# Patient Record
Sex: Female | Born: 1935 | Race: White | Hispanic: No | Smoking: Former smoker
Health system: Southern US, Community
[De-identification: ages and names within clinical notes are randomized; demographics above are authoritative.]

## PROBLEM LIST (undated history)

## (undated) DIAGNOSIS — K219 Gastro-esophageal reflux disease without esophagitis: Secondary | ICD-10-CM

## (undated) DIAGNOSIS — I509 Heart failure, unspecified: Secondary | ICD-10-CM

## (undated) DIAGNOSIS — Z8673 Personal history of transient ischemic attack (TIA), and cerebral infarction without residual deficits: Secondary | ICD-10-CM

## (undated) DIAGNOSIS — E785 Hyperlipidemia, unspecified: Secondary | ICD-10-CM

## (undated) DIAGNOSIS — J449 Chronic obstructive pulmonary disease, unspecified: Secondary | ICD-10-CM

## (undated) DIAGNOSIS — I2510 Atherosclerotic heart disease of native coronary artery without angina pectoris: Secondary | ICD-10-CM

## (undated) DIAGNOSIS — Z950 Presence of cardiac pacemaker: Secondary | ICD-10-CM

## (undated) DIAGNOSIS — G4700 Insomnia, unspecified: Secondary | ICD-10-CM

## (undated) DIAGNOSIS — M719 Bursopathy, unspecified: Secondary | ICD-10-CM

## (undated) DIAGNOSIS — I10 Essential (primary) hypertension: Secondary | ICD-10-CM

## (undated) DIAGNOSIS — I739 Peripheral vascular disease, unspecified: Secondary | ICD-10-CM

## (undated) DIAGNOSIS — M1990 Unspecified osteoarthritis, unspecified site: Secondary | ICD-10-CM

## (undated) DIAGNOSIS — D649 Anemia, unspecified: Secondary | ICD-10-CM

## (undated) DIAGNOSIS — R011 Cardiac murmur, unspecified: Secondary | ICD-10-CM

## (undated) DIAGNOSIS — R7303 Prediabetes: Secondary | ICD-10-CM

## (undated) DIAGNOSIS — E663 Overweight: Secondary | ICD-10-CM

## (undated) DIAGNOSIS — I350 Nonrheumatic aortic (valve) stenosis: Secondary | ICD-10-CM

## (undated) DIAGNOSIS — N8110 Cystocele, unspecified: Secondary | ICD-10-CM

## (undated) DIAGNOSIS — I519 Heart disease, unspecified: Secondary | ICD-10-CM

## (undated) DIAGNOSIS — N289 Disorder of kidney and ureter, unspecified: Secondary | ICD-10-CM

## (undated) DIAGNOSIS — R0602 Shortness of breath: Secondary | ICD-10-CM

## (undated) DIAGNOSIS — I4891 Unspecified atrial fibrillation: Secondary | ICD-10-CM

## (undated) HISTORY — DX: Overweight: E66.3

## (undated) HISTORY — DX: Heart disease, unspecified: I51.9

## (undated) HISTORY — DX: Presence of cardiac pacemaker: Z95.0

## (undated) HISTORY — DX: Disorder of kidney and ureter, unspecified: N28.9

## (undated) HISTORY — DX: Hyperlipidemia, unspecified: E78.5

## (undated) HISTORY — DX: Essential (primary) hypertension: I10

## (undated) HISTORY — DX: Atherosclerotic heart disease of native coronary artery without angina pectoris: I25.10

## (undated) HISTORY — DX: Insomnia, unspecified: G47.00

## (undated) HISTORY — DX: Nonrheumatic aortic (valve) stenosis: I35.0

## (undated) HISTORY — DX: Cystocele, unspecified: N81.10

## (undated) HISTORY — DX: Unspecified atrial fibrillation: I48.91

## (undated) HISTORY — PX: EYE SURGERY: SHX253

## (undated) HISTORY — DX: Chronic obstructive pulmonary disease, unspecified: J44.9

## (undated) HISTORY — DX: Personal history of transient ischemic attack (TIA), and cerebral infarction without residual deficits: Z86.73

## (undated) HISTORY — DX: Heart failure, unspecified: I50.9

## (undated) HISTORY — DX: Peripheral vascular disease, unspecified: I73.9

## (undated) HISTORY — DX: Bursopathy, unspecified: M71.9

---

## 2003-11-18 HISTORY — PX: WRIST SURGERY: SHX841

## 2007-11-18 HISTORY — PX: OTHER SURGICAL HISTORY: SHX169

## 2009-11-17 DIAGNOSIS — Z8673 Personal history of transient ischemic attack (TIA), and cerebral infarction without residual deficits: Secondary | ICD-10-CM

## 2009-11-17 HISTORY — PX: INSERT / REPLACE / REMOVE PACEMAKER: SUR710

## 2009-11-17 HISTORY — DX: Personal history of transient ischemic attack (TIA), and cerebral infarction without residual deficits: Z86.73

## 2010-09-17 DIAGNOSIS — Z950 Presence of cardiac pacemaker: Secondary | ICD-10-CM | POA: Diagnosis present

## 2010-09-17 HISTORY — DX: Presence of cardiac pacemaker: Z95.0

## 2010-10-21 ENCOUNTER — Ambulatory Visit: Payer: Self-pay | Admitting: Family Medicine

## 2010-10-21 DIAGNOSIS — M25559 Pain in unspecified hip: Secondary | ICD-10-CM

## 2010-10-21 DIAGNOSIS — I10 Essential (primary) hypertension: Secondary | ICD-10-CM | POA: Insufficient documentation

## 2010-10-21 DIAGNOSIS — M76899 Other specified enthesopathies of unspecified lower limb, excluding foot: Secondary | ICD-10-CM | POA: Insufficient documentation

## 2010-10-21 DIAGNOSIS — E785 Hyperlipidemia, unspecified: Secondary | ICD-10-CM

## 2010-10-23 ENCOUNTER — Telehealth (INDEPENDENT_AMBULATORY_CARE_PROVIDER_SITE_OTHER): Payer: Self-pay | Admitting: *Deleted

## 2010-11-13 ENCOUNTER — Encounter: Payer: Self-pay | Admitting: Family Medicine

## 2010-12-17 NOTE — Letter (Signed)
Summary: CONTROLLED MED PRESCRIPTION POLICY  CONTROLLED MED PRESCRIPTION POLICY   Imported By: Dannette Barbara 10/21/2010 11:26:56  _____________________________________________________________________  External Attachment:    Type:   Image     Comment:   External Document

## 2010-12-17 NOTE — Assessment & Plan Note (Signed)
Summary: PAIN IN RIGHT LEG (rm 4)   Vital Signs:  Patient Profile:   77 Years Old Female CC:      right leg pain x 2 weeks Height:     61 inches Weight:      112 pounds O2 Sat:      99 % O2 treatment:    Room Air Temp:     98.5 degrees F oral Pulse rate:   60 / minute Resp:     12 per minute BP sitting:   144 / 78  (right arm) Cuff size:   regular  Pt. in pain?   yes    Location:   right leg    Intensity:   7    Type:       burning/sharp  Vitals Entered By: Lajean Saver RN (October 21, 2010 11:13 AM)                   Updated Prior Medication List: AMITRIPTYLINE HCL 10 MG TABS (AMITRIPTYLINE HCL) qhs AMLODIPINE BESYLATE 10 MG TABS (AMLODIPINE BESYLATE)  ASPIRIN 81 MG TBEC (ASPIRIN)  CATAPRES 0.1 MG TABS (CLONIDINE HCL)  LISINOPRIL 5 MG TABS (LISINOPRIL)  MELOXICAM 7.5 MG TABS (MELOXICAM)  METOPROLOL SUCCINATE 50 MG XR24H-TAB (METOPROLOL SUCCINATE)  MULTIVITAMINS  TABS (MULTIPLE VITAMIN)  ZOCOR 20 MG TABS (SIMVASTATIN)   Current Allergies: ! PCN ! NOVOCAINHistory of Present Illness Chief Complaint: right leg pain x 2 weeks History of Present Illness:  Subjective:  Patient complains of pain in her right upper leg and hip for about 2 weeks.  In October, a large dog (lab) jumped up on her chest and she lost her balance but did not fall.  She has had some vague right lower back pain since then.  About a week ago she presented to the ER in [place] with chest symptoms and discovered to have an arrhythmia and she was admitted for pacemaker placement.  She states that an X-ray was taken of her right knee which was negative. She has pain in her right hip area when walking, and when supine on her right side at night.  REVIEW OF SYSTEMS Constitutional Symptoms      Denies fever, chills, night sweats, weight loss, weight gain, and fatigue.  Eyes       Denies change in vision, eye pain, eye discharge, glasses, contact lenses, and eye surgery. Ear/Nose/Throat/Mouth  Denies hearing loss/aids, change in hearing, ear pain, ear discharge, dizziness, frequent runny nose, frequent nose bleeds, sinus problems, sore throat, hoarseness, and tooth pain or bleeding.  Respiratory       Denies dry cough, productive cough, wheezing, shortness of breath, asthma, bronchitis, and emphysema/COPD.  Cardiovascular       Denies murmurs, chest pain, and tires easily with exhertion.    Gastrointestinal       Denies stomach pain, nausea/vomiting, diarrhea, constipation, blood in bowel movements, and indigestion. Genitourniary       Denies painful urination, blood or discharge from vagina, kidney stones, and loss of urinary control. Neurological       Denies paralysis, seizures, and fainting/blackouts. Musculoskeletal       Complains of muscle pain and swelling.      Denies joint pain, joint stiffness, decreased range of motion, redness, muscle weakness, and gout.      Comments: right leg Skin       Denies bruising, unusual mles/lumps or sores, and hair/skin or nail changes.  Psych       Denies  mood changes, temper/anger issues, anxiety/stress, speech problems, depression, and sleep problems. Other Comments: Patient c/o right leg pain x 2 weeks. It starts at her thigh and burns down to below her knee where there is a small knot. No redness or warmth is present. She states a dog jumped on her about 2 weeks ago.    Past History:  Past Medical History: PAD Hypertension Hyperlipidemia Pacemaker Murmur Cataracts  Past Surgical History: Permanent pacemaker wrist reconstruction after fall 2005 Artery replacement from rt arm to left leg  Social History: Current Smoker 1/2 PPD Alcohol use-no Drug use-no Smoking Status:  current Drug Use:  no   Objective:  No acute distress, alert and comfortable Neck:  Supple.  No adenopathy is present.   Lungs:  Clear to auscultation.  Breath sounds are equal.  Heart:  Regular rate and rhythm  Abdomen:  Nontender without masses  or hepatosplenomegaly.  Bowel sounds are present.  No CVA or flank tenderness.  Back:  Nontender Right hip:  Distinct tenderness over the greater trochanter Lower extremities:  No erythema, swelling, or tenderness. X-ray right hip:  negative Assessment New Problems: TROCHANTERIC BURSITIS, RIGHT (ICD-726.5) HIP PAIN, RIGHT (ICD-719.45) HYPERLIPIDEMIA (ICD-272.4) HYPERTENSION (ICD-401.9)   Plan New Medications/Changes: LORTAB 5 5-500 MG TABS (HYDROCODONE-ACETAMINOPHEN) One-half to one tab by mouth hs as needed pain  #10 (ten) x 0, 10/21/2010, Donna Christen MD FLECTOR 1.3 % [facility] (DICLOFENAC EPOLAMINE) Apply one-half to one patch to painful area bid  #15 x 0, 10/21/2010, Donna Christen MD  New Orders: T-DG Hip Complete*R* [16109] New Patient Level III [99203] Planning Comments:   Begin Flector patch to right hip.  Apply ice pack several times daily.  Analgesic at bedtime. Begin hip range of motion exercises ([facility] information and instruction patient handout given)  If not improving two weeks, recommend referral to sports med clinic.   The patient and/or caregiver has been counseled thoroughly with regard to medications prescribed including dosage, schedule, interactions, rationale for use, and possible side effects and they verbalize understanding.  Diagnoses and expected course of recovery discussed and will return if not improved as expected or if the condition worsens. Patient and/or caregiver verbalized understanding.  Prescriptions: LORTAB 5 5-500 MG TABS (HYDROCODONE-ACETAMINOPHEN) One-half to one tab by mouth hs as needed pain  #10 (ten) x 0   Entered and Authorized by:   Donna Christen MD   Signed by:   Donna Christen MD on 10/21/2010   Method used:   Print then Give to Patient   RxID:   [id number] FLECTOR 1.3 % [facility] (DICLOFENAC EPOLAMINE) Apply one-half to one patch to painful area bid  #15 x 0   Entered and Authorized by:   Donna Christen MD   Signed by:   Donna Christen MD on 10/21/2010   Method used:   Print then Give to Patient   RxID:   [id number]   Orders Added: 1)  T-DG Hip Complete*R* [29528] 2)  New Patient Level III [41324]

## 2010-12-17 NOTE — Progress Notes (Signed)
  Phone Note Outgoing Call Call back at [facility] Phone [phone]   Call placed by: Lajean Saver RN,  October 23, 2010 3:25 PM Call placed to: Patient Action Taken: Phone Call Completed Summary of Call: Returned patient's message requesting an alternate medication from the Flector patch that was denied by Medicaid. I informed patient I was calling in a gel as an alternative.  Diclofenac Gel 1% 100gm tube 5 pack called in to [facility] in [facility]. I spoke with Baxter Hire who referred me to the voicemail. Rx left on thier voicemail. Told to use two times a day as needed pain.

## 2010-12-19 NOTE — Letter (Signed)
Summary: APROVAL OF MEDICARE RX  APROVAL OF MEDICARE RX   Imported By: Dannette Barbara 11/13/2010 08:20:58  _____________________________________________________________________  External Attachment:    Type:   Image     Comment:   External Document

## 2011-09-25 ENCOUNTER — Other Ambulatory Visit (HOSPITAL_COMMUNITY)
Admission: RE | Admit: 2011-09-25 | Discharge: 2011-09-25 | Disposition: A | Discharge status: Home / Self Care | Payer: Medicare Other | Source: Ambulatory Visit | Attending: Obstetrics and Gynecology | Admitting: Obstetrics and Gynecology

## 2011-09-25 DIAGNOSIS — Z1159 Encounter for screening for other viral diseases: Secondary | ICD-10-CM | POA: Insufficient documentation

## 2011-09-25 DIAGNOSIS — Z124 Encounter for screening for malignant neoplasm of cervix: Secondary | ICD-10-CM | POA: Insufficient documentation

## 2011-11-18 HISTORY — PX: CORONARY ARTERY BYPASS GRAFT: SHX141

## 2011-11-18 HISTORY — PX: CARDIAC VALVE REPLACEMENT: SHX585

## 2012-02-16 ENCOUNTER — Encounter: Payer: Self-pay | Admitting: Surgery

## 2012-02-23 ENCOUNTER — Encounter: Payer: Medicare Other | Admitting: Surgery

## 2012-05-07 ENCOUNTER — Encounter: Payer: Self-pay | Admitting: Surgery

## 2012-05-10 ENCOUNTER — Ambulatory Visit (INDEPENDENT_AMBULATORY_CARE_PROVIDER_SITE_OTHER): Payer: Medicare Other | Admitting: Surgery

## 2012-05-10 ENCOUNTER — Encounter: Payer: Self-pay | Admitting: Surgery

## 2012-05-10 ENCOUNTER — Encounter (INDEPENDENT_AMBULATORY_CARE_PROVIDER_SITE_OTHER): Payer: Medicare Other | Admitting: *Deleted

## 2012-05-10 VITALS — BP 179/131 | HR 62 | Temp 98.6°F | Ht 60.0 in | Wt 134.0 lb

## 2012-05-10 DIAGNOSIS — Z48812 Encounter for surgical aftercare following surgery on the circulatory system: Secondary | ICD-10-CM

## 2012-05-10 DIAGNOSIS — I739 Peripheral vascular disease, unspecified: Secondary | ICD-10-CM

## 2012-05-10 DIAGNOSIS — I70219 Atherosclerosis of native arteries of extremities with intermittent claudication, unspecified extremity: Secondary | ICD-10-CM | POA: Insufficient documentation

## 2012-05-10 NOTE — Progress Notes (Signed)
[facility]   Patient name: Chelsey Campbell MRN: [id number] DOB: 12-27-1927 Sex: female   Referred by: Dr. Sandi Mealy  Reason for referral:  Chief Complaint  Patient presents with  . PVD    New pt, referred by Dr. Sandi Mealy    HISTORY OF PRESENT ILLNESS: This is a 78 year old female that I am asked to see and evaluate for peripheral vascular disease. She is status post left leg bypass graft in 2009 in [place] by Dr. Craige Cotta. According to the patient this was done for acute ischemia. I do not have any records available. She had a vein harvested from her right arm. She has no lower extremity complaints today. She denies claudication. She denies ulceration. She denies rest pain.  She is recently status post open-heart surgery and valve replacement in March of this year. She suffers from hypertension which has been poorly controlled. Her cholesterol is reportedly under adequate control.  Past Medical History  Diagnosis Date  . Hypertension   . Hyperlipidemia   . Insomnia   . Overweight   . Vaginal prolapse     with pesary  . S/P cardiac pacemaker procedure November 2011    for tachy/brady syndrome  . Peripheral vascular disease     S/P  femoral bypass  . History of TIA (transient ischemic attack)   . Bursitis     in joints  . Aortic stenosis, severe   . Renal insufficiency   . Ventricular dysfunction     Left mild with EF 45-50%  . CHF (congestive heart failure)   . COPD (chronic obstructive pulmonary disease)     Past Surgical History  Procedure Date  . Eye surgery      Bilateral Cataracts  . Coronary artery bypass graft 2013    replaced aortic valve    History   Social History  . Marital Status: Single    Spouse Name: N/A    Number of Children: N/A  . Years of Education: N/A   Occupational History  . Not on file.   Social History Main Topics  . Smoking status: Former Smoker -- 50 years    Types: Cigarettes    Quit date: 10/11/2010  .  Smokeless tobacco: Never Used  . Alcohol Use: No  . Drug Use: No  . Sexually Active: Not on file   Other Topics Concern  . Not on file   Social History Narrative  . No narrative on file    Family History  Problem Relation Age of Onset  . Heart disease Father   . Cancer Sister     brain    Allergies as of 05/10/2012 - Review Complete 05/10/2012  Allergen Reaction Noted  . Procaine hcl Anaphylaxis 10/21/2010  . Penicillins Rash 10/21/2010    Current Outpatient Prescriptions on File Prior to Visit  Medication Sig Dispense Refill  . albuterol (PROVENTIL HFA;VENTOLIN HFA) 108 (90 BASE) MCG/ACT inhaler Inhale 2 puffs into the lungs. Used at night      . aspirin 81 MG tablet Take 81 mg by mouth daily.      . Multiple Vitamins-Minerals (MULTIVITAMIN WITH MINERALS) tablet Take 1 tablet by mouth daily.      . nortriptyline (PAMELOR) 10 MG capsule Take 10 mg by mouth at bedtime.      Dani Gobble SULFATE VAGINAL (TRIMO-SAN) 0.025 % GEL Place 0.025 g vaginally 3 (three) times a week.      . simvastatin (ZOCOR) 10 MG tablet Take 20 mg  by mouth at bedtime.      Marland Kitchen tiotropium (SPIRIVA) 18 MCG inhalation capsule Place 18 mcg into inhaler and inhale daily.      Marland Kitchen amitriptyline (ELAVIL) 10 MG tablet Take 10 mg by mouth at bedtime.      Marland Kitchen AMLODIPINE BESYLATE PO Take 10 mg by mouth daily.      . cloNIDine HCl (KAPVAY) 0.1 MG TB12 ER tablet Take by mouth.      Marland Kitchen lisinopril (PRINIVIL,ZESTRIL) 2.5 MG tablet Take 2.5 mg by mouth daily.      Marland Kitchen METOPROLOL TARTRATE PO Take 100 mg by mouth 2 (two) times daily.          REVIEW OF SYSTEMS: Cardiovascular: No chest pain, chest pressure, palpitations, orthopnea, or dyspnea on exertion. No claudication or rest pain,  No history of DVT or phlebitis. Pulmonary: No productive cough, asthma or wheezing. Neurologic: No weakness, paresthesias, aphasia, or amaurosis. No dizziness. Hematologic: No bleeding problems or clotting disorders. Musculoskeletal: No  joint pain or joint swelling. Gastrointestinal: No blood in stool or hematemesis Genitourinary: No dysuria or hematuria. Psychiatric:: No history of major depression. Integumentary: No rashes or ulcers. Constitutional: No fever or chills.  PHYSICAL EXAMINATION: General: The patient appears their stated age.  Vital signs are BP 179/131  Pulse 62  Temp 98.6 F (37 C) (Oral)  Ht 5' (1.524 m)  Wt 134 lb (60.782 kg)  BMI 26.17 kg/m2  SpO2 97% HEENT:  No gross abnormalities Pulmonary: Respirations are non-labored Musculoskeletal: There are no major deformities.   Neurologic: No focal weakness or paresthesias are detected, Skin: There are no ulcer or rashes noted. Psychiatric: The patient has normal affect. Cardiovascular: There is a regular rate and rhythm without significant murmur appreciated. I cannot palpate a pedal pulse on the left. There is mild edema bilaterally.  Diagnostic Studies: I ordered and reviewed her duplex ultrasound today. This shows an ABI of 0.92 on the left with triphasic waveforms of 0.95 on the right with triphasic waveforms. Duplex of the bypass graft reveals no evidence of stenosis.   Assessment:  Peripheral vascular disease status post left leg bypass graft Plan: Based on her ultrasound today her bypass graft is widely patent. There is no evidence of stenosis. The patient is here to establish vascular care and followup. There are no acute vascular issues. She will need to have carotid surveillance done I sent this was done prior to her heart surgery I do not have these results. She will be scheduled to come back to see me in one year with a repeat ultrasound to evaluate her bypass graft.     Jorge Ny, M.D. [facility] [place] Office: [phone] Pager:  [phone]

## 2012-05-10 NOTE — Procedures (Unsigned)
LOWER EXTREMITY ARTERIAL DUPLEX  INDICATION:  Followup left lower extremity bypass graft.  HISTORY: Diabetes:  No Cardiac:  Aortic stenosis, pacemaker Hypertension:  Yes Smoking:  Previous Previous Surgery:  Left femoral bypass performed in [place] in 2009  SINGLE LEVEL ARTERIAL EXAM                         RIGHT                LEFT Brachial: Anterior tibial: Posterior tibial: Peroneal: Ankle/Brachial Index:   0.95                 0.92  LOWER EXTREMITY ARTERIAL DUPLEX EXAM  DUPLEX:  Patent left mid SFA to posterior tibial artery bypass graft with triphasic waveforms noted throughout the bypass graft and the native vessels.  IMPRESSION: 1. Patent left lower extremity bypass graft with no evidence of     stenosis. 2. Bilateral ankle brachial indices are suggestive of mild arterial     disease. 3. No prior ankle brachial indices for comparison.  ___________________________________________ V. Charlena Cross, MD  EM/MEDQ  D:  05/10/2012  T:  05/10/2012  Job:  [id number]

## 2012-05-18 ENCOUNTER — Other Ambulatory Visit: Payer: Self-pay

## 2012-05-18 DIAGNOSIS — I739 Peripheral vascular disease, unspecified: Secondary | ICD-10-CM

## 2012-05-18 DIAGNOSIS — Z48812 Encounter for surgical aftercare following surgery on the circulatory system: Secondary | ICD-10-CM

## 2012-12-31 ENCOUNTER — Encounter (HOSPITAL_COMMUNITY): Payer: Self-pay | Admitting: Pharmacist

## 2013-01-04 ENCOUNTER — Other Ambulatory Visit: Payer: Self-pay | Admitting: Obstetrics and Gynecology

## 2013-01-05 ENCOUNTER — Encounter (HOSPITAL_COMMUNITY)
Admission: RE | Admit: 2013-01-05 | Discharge: 2013-01-05 | Disposition: A | Discharge status: Home / Self Care | Payer: Medicare Other | Source: Ambulatory Visit | Attending: Obstetrics and Gynecology | Admitting: Obstetrics and Gynecology

## 2013-01-05 ENCOUNTER — Encounter (HOSPITAL_COMMUNITY): Payer: Self-pay

## 2013-01-05 HISTORY — DX: Anemia, unspecified: D64.9

## 2013-01-05 HISTORY — DX: Cardiac murmur, unspecified: R01.1

## 2013-01-05 HISTORY — DX: Shortness of breath: R06.02

## 2013-01-05 HISTORY — DX: Gastro-esophageal reflux disease without esophagitis: K21.9

## 2013-01-05 HISTORY — DX: Presence of cardiac pacemaker: Z95.0

## 2013-01-05 LAB — BASIC METABOLIC PANEL
CO2: 24 mEq/L (ref 19–32)
Chloride: 103 mEq/L (ref 96–112)
Glucose, Bld: 141 mg/dL — ABNORMAL HIGH (ref 70–99)
Potassium: 4.7 mEq/L (ref 3.5–5.1)
Sodium: 138 mEq/L (ref 135–145)

## 2013-01-05 LAB — CBC
HCT: 42 % (ref 36.0–46.0)
Hemoglobin: 14.1 g/dL (ref 12.0–15.0)
MCH: 31.1 pg (ref 26.0–34.0)
MCV: 92.5 fL (ref 78.0–100.0)
RBC: 4.54 MIL/uL (ref 3.87–5.11)

## 2013-01-05 LAB — SURGICAL PCR SCREEN: MRSA, PCR: NEGATIVE

## 2013-01-05 NOTE — Pre-Procedure Instructions (Signed)
Dr Rodman Pickle saw patient at PAT appt.  Medical history, meds were reviewed and all questions answered.

## 2013-01-05 NOTE — Patient Instructions (Signed)
   Your procedure is scheduled on:  Wednesday Feb 26  Enter through the [employer] of [facility] at: 11 am Pick up the phone at the desk and dial [phone] and inform us of your arrival.  Please call this number if you have any problems the morning of surgery: [phone]  Remember: Do not eat food after midnight: Tuesday Do not drink clear liquids after: 830 am Wednesday Take these medicines the morning of surgery with a SIP OF WATER:  Do not wear jewelry, make-up, or FINGER nail polish No metal in your hair or on your body. Do not wear lotions, powders, perfumes. You may wear deodorant.  Please use your CHG wash as directed prior to surgery.  Do not shave anywhere for at least 12 hours prior to first CHG shower.  Do not bring valuables to the hospital. Contacts, dentures or bridgework may not be worn into surgery.  Leave suitcase in the car. After Surgery it may be brought to your room. For patients being admitted to the hospital, checkout time is 11:00am the day of discharge.  Home with daughter Odelia Gage.

## 2013-01-11 MED ORDER — CIPROFLOXACIN IN D5W 400 MG/200ML IV SOLN
400.0000 mg | INTRAVENOUS | Status: AC
Start: 2013-01-12 — End: 2013-01-12
  Administered 2013-01-12: 400 mg via INTRAVENOUS
  Filled 2013-01-11: qty 200

## 2013-01-11 MED ORDER — CLINDAMYCIN PHOSPHATE 900 MG/50ML IV SOLN
900.0000 mg | INTRAVENOUS | Status: DC
  Filled 2013-01-11: qty 50

## 2013-01-12 ENCOUNTER — Ambulatory Visit (HOSPITAL_COMMUNITY): Payer: Medicare Other

## 2013-01-12 ENCOUNTER — Ambulatory Visit (HOSPITAL_COMMUNITY): Payer: Medicare Other | Admitting: Anesthesiology

## 2013-01-12 ENCOUNTER — Ambulatory Visit (HOSPITAL_COMMUNITY)
Admission: RE | Admit: 2013-01-12 | Discharge: 2013-01-14 | Disposition: A | Discharge status: Home / Self Care | Payer: Medicare Other | Source: Ambulatory Visit | Attending: Obstetrics and Gynecology | Admitting: Obstetrics and Gynecology

## 2013-01-12 ENCOUNTER — Encounter (HOSPITAL_COMMUNITY)
Admission: RE | Disposition: A | Discharge status: Home / Self Care | Payer: Self-pay | Source: Ambulatory Visit | Attending: Obstetrics and Gynecology

## 2013-01-12 ENCOUNTER — Encounter (HOSPITAL_COMMUNITY): Payer: Self-pay | Admitting: Anesthesiology

## 2013-01-12 DIAGNOSIS — N189 Chronic kidney disease, unspecified: Secondary | ICD-10-CM | POA: Diagnosis present

## 2013-01-12 DIAGNOSIS — Z90710 Acquired absence of both cervix and uterus: Secondary | ICD-10-CM

## 2013-01-12 DIAGNOSIS — N838 Other noninflammatory disorders of ovary, fallopian tube and broad ligament: Secondary | ICD-10-CM | POA: Insufficient documentation

## 2013-01-12 DIAGNOSIS — N812 Incomplete uterovaginal prolapse: Secondary | ICD-10-CM | POA: Insufficient documentation

## 2013-01-12 DIAGNOSIS — N83209 Unspecified ovarian cyst, unspecified side: Secondary | ICD-10-CM | POA: Insufficient documentation

## 2013-01-12 DIAGNOSIS — D251 Intramural leiomyoma of uterus: Secondary | ICD-10-CM | POA: Insufficient documentation

## 2013-01-12 DIAGNOSIS — N289 Disorder of kidney and ureter, unspecified: Secondary | ICD-10-CM | POA: Insufficient documentation

## 2013-01-12 DIAGNOSIS — N179 Acute kidney failure, unspecified: Secondary | ICD-10-CM | POA: Diagnosis present

## 2013-01-12 DIAGNOSIS — D279 Benign neoplasm of unspecified ovary: Secondary | ICD-10-CM | POA: Insufficient documentation

## 2013-01-12 HISTORY — PX: SALPINGOOPHORECTOMY: SHX82

## 2013-01-12 HISTORY — PX: ABDOMINAL HYSTERECTOMY: SHX81

## 2013-01-12 HISTORY — PX: CYSTOSCOPY: SHX5120

## 2013-01-12 HISTORY — PX: CYSTOCELE REPAIR: SHX163

## 2013-01-12 HISTORY — PX: LAPAROSCOPIC ASSISTED VAGINAL HYSTERECTOMY: SHX5398

## 2013-01-12 SURGERY — HYSTERECTOMY, VAGINAL, LAPAROSCOPY-ASSISTED
Anesthesia: General | Site: Vagina | Wound class: Clean Contaminated

## 2013-01-12 MED ORDER — KETOROLAC TROMETHAMINE 30 MG/ML IJ SOLN: 15.0000 mg | Freq: Once | INTRAMUSCULAR | Status: DC | PRN

## 2013-01-12 MED ORDER — EPHEDRINE SULFATE 50 MG/ML IJ SOLN
INTRAMUSCULAR | Status: DC | PRN
  Administered 2013-01-12: 15 mg via INTRAVENOUS
  Administered 2013-01-12: 1 mg via INTRAVENOUS

## 2013-01-12 MED ORDER — IRBESARTAN 300 MG PO TABS
300.0000 mg | ORAL_TABLET | Freq: Every day | ORAL | Status: DC
  Filled 2013-01-12: qty 1

## 2013-01-12 MED ORDER — HYDROMORPHONE HCL PF 1 MG/ML IJ SOLN: 0.2000 mg | INTRAMUSCULAR | Status: DC | PRN

## 2013-01-12 MED ORDER — EPHEDRINE 5 MG/ML INJ
INTRAVENOUS | Status: AC
  Filled 2013-01-12: qty 10

## 2013-01-12 MED ORDER — FENTANYL CITRATE 0.05 MG/ML IJ SOLN
INTRAMUSCULAR | Status: DC | PRN
Start: 2013-01-12 — End: 2013-01-12
  Administered 2013-01-12: 50 ug via INTRAVENOUS
  Administered 2013-01-12: 25 ug via INTRAVENOUS
  Administered 2013-01-12: 50 ug via INTRAVENOUS
  Administered 2013-01-12 (×3): 25 ug via INTRAVENOUS

## 2013-01-12 MED ORDER — BUPIVACAINE HCL (PF) 0.25 % IJ SOLN
INTRAMUSCULAR | Status: AC
  Filled 2013-01-12: qty 30

## 2013-01-12 MED ORDER — PHENYLEPHRINE HCL 10 MG/ML IJ SOLN
INTRAMUSCULAR | Status: DC | PRN
  Administered 2013-01-12: 80 ug via INTRAVENOUS
  Administered 2013-01-12: 160 ug via INTRAVENOUS
  Administered 2013-01-12: 80 ug via INTRAVENOUS
  Administered 2013-01-12 (×2): 120 ug via INTRAVENOUS
  Administered 2013-01-12: 40 ug via INTRAVENOUS

## 2013-01-12 MED ORDER — ONDANSETRON HCL 4 MG/2ML IJ SOLN
INTRAMUSCULAR | Status: AC
  Filled 2013-01-12: qty 2

## 2013-01-12 MED ORDER — MIDAZOLAM HCL 2 MG/2ML IJ SOLN
INTRAMUSCULAR | Status: AC
  Filled 2013-01-12: qty 2

## 2013-01-12 MED ORDER — HEPARIN SODIUM (PORCINE) 5000 UNIT/ML IJ SOLN
INTRAMUSCULAR | Status: DC | PRN
  Administered 2013-01-12: 5000 [IU] via SUBCUTANEOUS

## 2013-01-12 MED ORDER — DIPHENHYDRAMINE HCL 50 MG/ML IJ SOLN
INTRAMUSCULAR | Status: DC | PRN
  Administered 2013-01-12 (×2): 12.5 mg via INTRAVENOUS

## 2013-01-12 MED ORDER — FUROSEMIDE 10 MG/ML IJ SOLN
INTRAMUSCULAR | Status: AC
  Filled 2013-01-12: qty 2

## 2013-01-12 MED ORDER — LACTATED RINGERS IV SOLN
INTRAVENOUS | Status: DC
  Administered 2013-01-12 (×2): via INTRAVENOUS

## 2013-01-12 MED ORDER — INDIGOTINDISULFONATE SODIUM 8 MG/ML IJ SOLN
INTRAMUSCULAR | Status: DC | PRN
  Administered 2013-01-12: 5 mL via INTRAVENOUS

## 2013-01-12 MED ORDER — FENTANYL CITRATE 0.05 MG/ML IJ SOLN
INTRAMUSCULAR | Status: AC
  Filled 2013-01-12: qty 2

## 2013-01-12 MED ORDER — FAMOTIDINE 20 MG PO TABS: 20.0000 mg | ORAL_TABLET | Freq: Two times a day (BID) | ORAL | Status: DC | PRN

## 2013-01-12 MED ORDER — DIPHENHYDRAMINE HCL 50 MG/ML IJ SOLN: 50.0000 mg | Freq: Once | INTRAMUSCULAR | Status: DC

## 2013-01-12 MED ORDER — ONDANSETRON HCL 4 MG/2ML IJ SOLN
INTRAMUSCULAR | Status: DC | PRN
  Administered 2013-01-12: 4 mg via INTRAVENOUS

## 2013-01-12 MED ORDER — PHENYLEPHRINE 40 MCG/ML (10ML) SYRINGE FOR IV PUSH (FOR BLOOD PRESSURE SUPPORT)
PREFILLED_SYRINGE | INTRAVENOUS | Status: AC
  Filled 2013-01-12: qty 10

## 2013-01-12 MED ORDER — PROPOFOL 10 MG/ML IV EMUL
INTRAVENOUS | Status: DC | PRN
  Administered 2013-01-12: 50 mg via INTRAVENOUS
  Administered 2013-01-12: 150 mg via INTRAVENOUS

## 2013-01-12 MED ORDER — ESTRADIOL 0.1 MG/GM VA CREA
TOPICAL_CREAM | VAGINAL | Status: DC | PRN
  Administered 2013-01-12: 1 via VAGINAL

## 2013-01-12 MED ORDER — LIDOCAINE-EPINEPHRINE 1 %-1:100000 IJ SOLN
INTRAMUSCULAR | Status: DC | PRN
  Administered 2013-01-12 (×2): 20 mL

## 2013-01-12 MED ORDER — PHENYLEPHRINE 40 MCG/ML (10ML) SYRINGE FOR IV PUSH (FOR BLOOD PRESSURE SUPPORT)
PREFILLED_SYRINGE | INTRAVENOUS | Status: AC
  Filled 2013-01-12: qty 5

## 2013-01-12 MED ORDER — KETOROLAC TROMETHAMINE 30 MG/ML IJ SOLN
INTRAMUSCULAR | Status: AC
  Filled 2013-01-12: qty 1

## 2013-01-12 MED ORDER — BUPIVACAINE HCL (PF) 0.25 % IJ SOLN
INTRAMUSCULAR | Status: DC | PRN
  Administered 2013-01-12: 20 mL

## 2013-01-12 MED ORDER — PHENYLEPHRINE HCL 10 MG/ML IJ SOLN
10.0000 mg | INTRAVENOUS | Status: DC | PRN
  Administered 2013-01-12: 50 ug/min via INTRAVENOUS
  Administered 2013-01-12: 15:00:00 via INTRAVENOUS

## 2013-01-12 MED ORDER — MEPERIDINE HCL 25 MG/ML IJ SOLN: 6.2500 mg | INTRAMUSCULAR | Status: DC | PRN

## 2013-01-12 MED ORDER — IRBESARTAN 300 MG PO TABS
300.0000 mg | ORAL_TABLET | Freq: Every day | ORAL | Status: DC
  Filled 2013-01-12 (×2): qty 1

## 2013-01-12 MED ORDER — LACTATED RINGERS IV SOLN
INTRAVENOUS | Status: DC
  Administered 2013-01-12 (×2): via INTRAVENOUS

## 2013-01-12 MED ORDER — DEXAMETHASONE SODIUM PHOSPHATE 4 MG/ML IJ SOLN
INTRAMUSCULAR | Status: DC | PRN
  Administered 2013-01-12: 10 mg via INTRAVENOUS

## 2013-01-12 MED ORDER — OXYCODONE-ACETAMINOPHEN 5-325 MG PO TABS
1.0000 | ORAL_TABLET | ORAL | Status: DC | PRN
  Administered 2013-01-13 (×2): 1 via ORAL
  Filled 2013-01-12 (×2): qty 1

## 2013-01-12 MED ORDER — FENTANYL CITRATE 0.05 MG/ML IJ SOLN: 25.0000 ug | INTRAMUSCULAR | Status: DC | PRN

## 2013-01-12 MED ORDER — SODIUM CHLORIDE 0.9 % IV SOLN: 250.0000 mL | INTRAVENOUS | Status: DC | PRN

## 2013-01-12 MED ORDER — ESTRADIOL 0.1 MG/GM VA CREA
TOPICAL_CREAM | VAGINAL | Status: AC
  Filled 2013-01-12: qty 42.5

## 2013-01-12 MED ORDER — FAMOTIDINE 20 MG PO TABS
ORAL_TABLET | ORAL | Status: AC
  Administered 2013-01-12: 20 mg via ORAL
  Filled 2013-01-12: qty 1

## 2013-01-12 MED ORDER — FAMOTIDINE 20 MG PO TABS: 20.0000 mg | ORAL_TABLET | Freq: Once | ORAL | Status: AC

## 2013-01-12 MED ORDER — STERILE WATER FOR IRRIGATION IR SOLN
Status: DC | PRN
  Administered 2013-01-12: 1000 mL

## 2013-01-12 MED ORDER — PHENYLEPHRINE HCL 10 MG/ML IJ SOLN
INTRAMUSCULAR | Status: AC
  Filled 2013-01-12: qty 1

## 2013-01-12 MED ORDER — LIDOCAINE HCL (CARDIAC) 20 MG/ML IV SOLN
INTRAVENOUS | Status: AC
  Filled 2013-01-12: qty 5

## 2013-01-12 MED ORDER — GENTAMICIN SULFATE 40 MG/ML IJ SOLN
Freq: Once | INTRAVENOUS | Status: AC
  Administered 2013-01-12: 112.25 mL via INTRAVENOUS
  Filled 2013-01-12: qty 6.25

## 2013-01-12 MED ORDER — ALBUTEROL SULFATE HFA 108 (90 BASE) MCG/ACT IN AERS
2.0000 | INHALATION_SPRAY | Freq: Four times a day (QID) | RESPIRATORY_TRACT | Status: DC | PRN
  Administered 2013-01-13: 2 via RESPIRATORY_TRACT
  Filled 2013-01-12: qty 6.7

## 2013-01-12 MED ORDER — ONDANSETRON HCL 4 MG PO TABS: 4.0000 mg | ORAL_TABLET | Freq: Four times a day (QID) | ORAL | Status: DC | PRN

## 2013-01-12 MED ORDER — SODIUM CHLORIDE 0.9 % IJ SOLN
3.0000 mL | Freq: Two times a day (BID) | INTRAMUSCULAR | Status: DC
  Administered 2013-01-13: 3 mL via INTRAVENOUS

## 2013-01-12 MED ORDER — LACTATED RINGERS IR SOLN
Status: DC | PRN
  Administered 2013-01-12: 3000 mL

## 2013-01-12 MED ORDER — SODIUM CHLORIDE 0.9 % IJ SOLN
3.0000 mL | INTRAMUSCULAR | Status: DC | PRN
  Administered 2013-01-12: 3 mL via INTRAVENOUS

## 2013-01-12 MED ORDER — DIPHENHYDRAMINE HCL 50 MG/ML IJ SOLN
INTRAMUSCULAR | Status: AC
  Filled 2013-01-12: qty 1

## 2013-01-12 MED ORDER — HYDROCHLOROTHIAZIDE 25 MG PO TABS
25.0000 mg | ORAL_TABLET | Freq: Every day | ORAL | Status: DC
  Administered 2013-01-13: 25 mg via ORAL
  Filled 2013-01-12 (×2): qty 1

## 2013-01-12 MED ORDER — INDIGOTINDISULFONATE SODIUM 8 MG/ML IJ SOLN
INTRAMUSCULAR | Status: AC
  Filled 2013-01-12: qty 5

## 2013-01-12 MED ORDER — TIOTROPIUM BROMIDE MONOHYDRATE 18 MCG IN CAPS
18.0000 ug | ORAL_CAPSULE | Freq: Every day | RESPIRATORY_TRACT | Status: DC
  Filled 2013-01-12: qty 5

## 2013-01-12 MED ORDER — FUROSEMIDE 10 MG/ML IJ SOLN
INTRAMUSCULAR | Status: DC | PRN
  Administered 2013-01-12: 10 mg via INTRAVENOUS

## 2013-01-12 MED ORDER — AMLODIPINE BESYLATE 2.5 MG PO TABS
2.5000 mg | ORAL_TABLET | Freq: Every day | ORAL | Status: DC
  Administered 2013-01-12 – 2013-01-13 (×2): 2.5 mg via ORAL
  Filled 2013-01-12 (×3): qty 1

## 2013-01-12 MED ORDER — 0.9 % SODIUM CHLORIDE (POUR BTL) OPTIME
TOPICAL | Status: DC | PRN
  Administered 2013-01-12: 1000 mL

## 2013-01-12 MED ORDER — FUROSEMIDE 10 MG/ML IJ SOLN
10.0000 mg | Freq: Once | INTRAMUSCULAR | Status: AC
  Administered 2013-01-12: 10 mg via INTRAVENOUS
  Filled 2013-01-12: qty 2

## 2013-01-12 MED ORDER — METOPROLOL TARTRATE 100 MG PO TABS
100.0000 mg | ORAL_TABLET | Freq: Two times a day (BID) | ORAL | Status: DC
  Administered 2013-01-13 (×2): 100 mg via ORAL
  Filled 2013-01-12 (×4): qty 1

## 2013-01-12 MED ORDER — ONDANSETRON HCL 4 MG/2ML IJ SOLN: 4.0000 mg | Freq: Four times a day (QID) | INTRAMUSCULAR | Status: DC | PRN

## 2013-01-12 MED ORDER — SIMETHICONE 80 MG PO CHEW: 80.0000 mg | CHEWABLE_TABLET | Freq: Four times a day (QID) | ORAL | Status: DC | PRN

## 2013-01-12 MED ORDER — PROPOFOL 10 MG/ML IV EMUL
INTRAVENOUS | Status: AC
  Filled 2013-01-12: qty 20

## 2013-01-12 MED ORDER — LIDOCAINE HCL (CARDIAC) 20 MG/ML IV SOLN
INTRAVENOUS | Status: DC | PRN
  Administered 2013-01-12: 20 mg via INTRAVENOUS

## 2013-01-12 MED ORDER — ONDANSETRON HCL 4 MG/2ML IJ SOLN: 4.0000 mg | Freq: Once | INTRAMUSCULAR | Status: DC | PRN

## 2013-01-12 SURGICAL SUPPLY — 55 items
APPLICATOR COTTON TIP 6IN STRL (MISCELLANEOUS) ×6 IMPLANT
CANISTER SUCTION 2500CC (MISCELLANEOUS) ×6 IMPLANT
CATH ROBINSON RED A/P 16FR (CATHETERS) IMPLANT
CLOTH BEACON ORANGE TIMEOUT ST (SAFETY) ×12 IMPLANT
CONT PATH 16OZ SNAP LID 3702 (MISCELLANEOUS) ×6 IMPLANT
CONTAINER PREFILL 10% NBF 60ML (FORM) ×6 IMPLANT
COVER TABLE BACK 60X90 (DRAPES) ×6 IMPLANT
DECANTER SPIKE VIAL GLASS SM (MISCELLANEOUS) ×12 IMPLANT
DERMABOND ADHESIVE PROPEN (GAUZE/BANDAGES/DRESSINGS) ×1
DERMABOND ADVANCED (GAUZE/BANDAGES/DRESSINGS) ×1
DERMABOND ADVANCED .7 DNX12 (GAUZE/BANDAGES/DRESSINGS) ×5 IMPLANT
DERMABOND ADVANCED .7 DNX6 (GAUZE/BANDAGES/DRESSINGS) ×5 IMPLANT
DRAPE STERI URO 9X17 APER PCH (DRAPES) ×6 IMPLANT
ELECT LIGASURE LONG (ELECTRODE) IMPLANT
ELECT LIGASURE SHORT 9 REUSE (ELECTRODE) IMPLANT
ELECT REM PT RETURN 9FT ADLT (ELECTROSURGICAL) ×6
ELECTRODE REM PT RTRN 9FT ADLT (ELECTROSURGICAL) ×5 IMPLANT
FILTER SMOKE EVAC LAPAROSHD (FILTER) ×6 IMPLANT
GAUZE PACKING 2X5 YD STERILE (GAUZE/BANDAGES/DRESSINGS) ×6 IMPLANT
GAUZE SPONGE 4X4 16PLY XRAY LF (GAUZE/BANDAGES/DRESSINGS) ×6 IMPLANT
GLOVE BIOGEL M 6.5 STRL (GLOVE) ×30 IMPLANT
GLOVE BIOGEL PI IND STRL 6.5 (GLOVE) ×15 IMPLANT
GLOVE BIOGEL PI INDICATOR 6.5 (GLOVE) ×3
GOWN PREVENTION PLUS XLARGE (GOWN DISPOSABLE) ×12 IMPLANT
GOWN STRL REIN XL XLG (GOWN DISPOSABLE) ×24 IMPLANT
NEEDLE HYPO 22GX1.5 SAFETY (NEEDLE) IMPLANT
NS IRRIG 1000ML POUR BTL (IV SOLUTION) ×12 IMPLANT
PACK LAVH (CUSTOM PROCEDURE TRAY) ×6 IMPLANT
PACK VAGINAL WOMENS (CUSTOM PROCEDURE TRAY) IMPLANT
PROTECTOR NERVE ULNAR (MISCELLANEOUS) ×6 IMPLANT
SEALER TISSUE G2 CVD JAW 35 (ENDOMECHANICALS) ×5 IMPLANT
SEALER TISSUE G2 CVD JAW 45CM (ENDOMECHANICALS) ×1
SET CYSTO W/LG BORE CLAMP LF (SET/KITS/TRAYS/PACK) ×6 IMPLANT
SET IRRIG TUBING LAPAROSCOPIC (IRRIGATION / IRRIGATOR) ×6 IMPLANT
SOLUTION ELECTROLUBE (MISCELLANEOUS) IMPLANT
STRIP CLOSURE SKIN 1/4X3 (GAUZE/BANDAGES/DRESSINGS) IMPLANT
SUT CHROMIC 2 0 CT 1 (SUTURE) ×12 IMPLANT
SUT MON AB 4-0 PS1 27 (SUTURE) ×6 IMPLANT
SUT VIC AB 0 CT1 18XCR BRD8 (SUTURE) ×10 IMPLANT
SUT VIC AB 0 CT1 27 (SUTURE) ×4
SUT VIC AB 0 CT1 27XBRD ANBCTR (SUTURE) ×20 IMPLANT
SUT VIC AB 0 CT1 36 (SUTURE) ×18 IMPLANT
SUT VIC AB 0 CT1 8-18 (SUTURE) ×2
SUT VIC AB 0 CTXB 36 (SUTURE) IMPLANT
SUT VIC AB 2-0 CT1 (SUTURE) IMPLANT
SUT VIC AB 2-0 CT2 27 (SUTURE) ×12 IMPLANT
SUT VIC AB 2-0 SH 27 (SUTURE)
SUT VIC AB 2-0 SH 27XBRD (SUTURE) IMPLANT
SUT VICRYL 1 TIES 12X18 (SUTURE) ×6 IMPLANT
SUT VICRYL 4-0 PS2 18IN ABS (SUTURE) IMPLANT
TOWEL OR 17X24 6PK STRL BLUE (TOWEL DISPOSABLE) ×24 IMPLANT
TRAY FOLEY CATH 14FR (SET/KITS/TRAYS/PACK) ×12 IMPLANT
TROCAR XCEL NON-BLD 5MMX100MML (ENDOMECHANICALS) ×18 IMPLANT
WARMER LAPAROSCOPE (MISCELLANEOUS) ×6 IMPLANT
WATER STERILE IRR 1000ML POUR (IV SOLUTION) ×12 IMPLANT

## 2013-01-12 NOTE — Anesthesia Postprocedure Evaluation (Signed)
  Anesthesia Post Note  Patient: Chelsey Campbell  Procedure(s) Performed: Procedure(s) (LRB): LAPAROSCOPIC ASSISTED VAGINAL HYSTERECTOMY (N/A) SALPINGO OOPHORECTOMY (Bilateral) ANTERIOR REPAIR (CYSTOCELE) (N/A) CYSTOSCOPY (N/A)  Anesthesia type: GA  Patient location: PACU  Post pain: Pain level controlled  Post assessment: Post-op Vital signs reviewed  Last Vitals:  Filed Vitals:   01/12/13 1600  BP: 129/47  Pulse: 53  Temp:   Resp: 20    Post vital signs: Reviewed  Level of consciousness: sedated  Complications: No apparent anesthesia complications

## 2013-01-12 NOTE — Anesthesia Preprocedure Evaluation (Signed)
Anesthesia Evaluation  Patient identified by MRN, date of birth, ID band Patient awake    Reviewed: Allergy & Precautions, H&P , NPO status , Patient's Chart, lab work & pertinent test results, reviewed documented beta blocker date and time   Airway Mallampati: II      Dental no notable dental hx.    Pulmonary neg pulmonary ROS,  breath sounds clear to auscultation        Cardiovascular hypertension, Pt. on medications and Pt. on home beta blockers negative cardio ROS  Rhythm:Regular Rate:Normal - Systolic murmurs See Cardiology note from 12/23/12   Neuro/Psych negative neurological ROS  negative psych ROS   GI/Hepatic Neg liver ROS,   Endo/Other  negative endocrine ROS  Renal/GU   negative genitourinary   Musculoskeletal negative musculoskeletal ROS (+)   Abdominal Normal abdominal exam  (+)   Peds negative pediatric ROS (+)  Hematology negative hematology ROS (+)   Anesthesia Other Findings   Reproductive/Obstetrics                           Anesthesia Physical Anesthesia Plan  ASA: III  Anesthesia Plan: General   Post-op Pain Management:    Induction: Intravenous  Airway Management Planned: Oral ETT  Additional Equipment:   Intra-op Plan:   Post-operative Plan: Extubation in OR  Informed Consent:   Dental advisory given  Plan Discussed with: CRNA and Surgeon  Anesthesia Plan Comments: (1. Five lead ECG)        Anesthesia Quick Evaluation

## 2013-01-12 NOTE — Op Note (Signed)
01/12/2013  5:22 PM  PATIENT:  Chelsey Campbell  79 y.o. female  PRE-OPERATIVE DIAGNOSIS:  Uterine Prolapse 2 Cystocele  3. Complex left adnexal cyst  CPT Code 16109  POST-OPERATIVE DIAGNOSIS:  Same   CPT Code 60454  PROCEDURE:  Procedure(s): LAPAROSCOPIC ASSISTED VAGINAL HYSTERECTOMY (N/A) SALPINGO OOPHORECTOMY (Bilateral) ANTERIOR REPAIR (CYSTOCELE) (N/A) CYSTOSCOPY (N/A)  SURGEON:  Surgeon(s) and Role:    * Trevyn Lumpkin J. Richardson Dopp, MD - Primary    * Geryl Rankins, MD - Assisting  PHYSICIAN ASSISTANT: none   ASSISTANTS: Dr Geryl Rankins   ANESTHESIA:   general  EBL:  Total I/O In: 1900 [I.V.:1900] Out: 650 [Urine:575; Blood:75]  BLOOD ADMINISTERED:none  DRAINS: Urinary Catheter (Foley)   LOCAL MEDICATIONS USED:  MARCAINE    and LIDOCAINE   SPECIMEN:  Source of Specimen:  uterus cervix bilateral fallopian tubes and ovaries. left adnexal mass   DISPOSITION OF SPECIMEN:  PATHOLOGY  COUNTS:  YES  TOURNIQUET:  * No tourniquets in log *  DICTATION: .Dragon Dictation  PLAN OF CARE: Admit for overnight observation  PATIENT DISPOSITION:  ICU - extubated and stable.   Delay start of Pharmacological VTE agent (>24 hrs) due to surgical blood loss or risk of bleeding: not applicable    Procedure: Pt was taken to the operating room #4. She was placed in the dorsal lithotomy position prepped and draped in the normal sterile fashion. Speculum was placed into the vagina The anterior lip of the cervix was grasped with a single tooth tenaculum. The uterus was sounded to 8 cm. The [place] uterine manipulator was placed without difficulty.  Attention was turned to the patients abdomen where the umbilicus was injected with 25% marcaine. A 5 mm trocar was placed under direct visualization. Pneumoperitoneum was achieved with pCO2. The pelvis was examined with the findings noted above. A 5 mm trocar was placed in the right and left lower quadrants under direct visualization. The right ureter was  identified. The left ureter could not be visualized. The infundibula pelvic ligament on the right was cauterized and transected with the enseal. Followed by cauterization of the right round ligament. The bladder reflection was dissected to the midline. The uterine artery on the right was cauterized and transected.  The infundibula pelvic ligament on the left was cauterized and transected with the enseal. Followed by cauterization of the left round ligament. The bladder reflection was dissected to the midline. The uterine artery on the Left t was cauterized and transected. The laparoscopic instruments were removed and pneumoperitoneum was released.  Attention was turned to the vagina. The uterine manipulator was removed. A weighted speculum was placed. The cervix was grasped with tenaculums. The cervix was injected circumferentially with 1% lidocaine with epi. The cervix was thin incised circumferentially. The posterior culdesac was entered sharply. The bladder was dissected of the lower uterine segment and the anterior peritoneum was entered sharply. The uterosacral ligaments were grasped with haney clamps bilaterally transected and suture li gated with 0 vicryl. The cardinal ligaments clamped with the haney clamps transected and suture li gated with 0 vicryl. The uterus cervix and bilateral fallopian tubes and ovaries were completely excised at this point and sent to pathology.  A modified McCall suture of 0 vicryl was placed. Angle sutures of 0 vicryl were placed bilaterally. The McCall suture stitch was then tied. The anterior vaginal mucosa was injected with 1% lidocaine with epi. It was then incised with Metzenbaum scissors. The cystocele was dissected off the anterior vaginal mucosa with  sharp and blunt dissection. The pubovesical fascia was re-approximated with interrupted suture of 0 vicryl. The redundant vaginal mucosa was excised.  The vaginal mucosa with re-approximated with running locked  suture of  0 vicryl T Excellent hemostasis was noted.  Vaginal packing was placed.  Attention was then turned to the abdomen were the pneumoperitoneum was reestablished .  The ureters could not be visualized.  The trocars were removed under direct visualization. Pneumoperitoneum was released prior to removal of the umbilical trocar The abdominal incisions were closed with 4-0 vicryl and derma bond.   Indigo carmine was given IV and cystoscopy was performed with 30 degree cystoscope. No bladder injury was noted. Indigo Carmine was expressed from both ureters. The cystoscope was removed and the foley was replaced. The patient was awaken from anesthesia and taken to the recovery room awake and in stable condition.  Sponge lap and needle counts were correct times two.

## 2013-01-12 NOTE — Progress Notes (Addendum)
Notified by RN that pt had faint crackles at bases of lungs. VS reviewed.  O2 sat 96% on 2 liters I/Os reveiwed.  +~1100.  UOP 125 cc in last 2 hours per RN. Urine clear Gen:  NAD, upright in bed, [place] in place, comfortable CV:  RRR Lungs:  Mild bibasilar crackles, no rhonchi A/P:  Mild pulmonary edema by exam.  Volume status ahead. H/o Renal insufficiency, CAD Give Lasix IV now. Pt tolerating po.  Saline lock IV.  Strict I/Os. Chest Xray s/p Lasix. Encouraged incentive spirometry.

## 2013-01-12 NOTE — H&P (Signed)
Date of Initial H&P: 01/05/2013  History reviewed, patient examined, Pt had an allergic reaction at the infusion site  to cipro  In preop holding area..  Will switch preop antibiotic to gentamicin and clindamycin.

## 2013-01-12 NOTE — Transfer of Care (Signed)
Immediate Anesthesia Transfer of Care Note  Patient: Chelsey Campbell  Procedure(s) Performed: Procedure(s): LAPAROSCOPIC ASSISTED VAGINAL HYSTERECTOMY (N/A) SALPINGO OOPHORECTOMY (Bilateral) ANTERIOR REPAIR (CYSTOCELE) (N/A) CYSTOSCOPY (N/A)  Patient Location: PACU  Anesthesia Type:General  Level of Consciousness: sedated  Airway & Oxygen Therapy: Patient Spontanous Breathing and Patient connected to nasal cannula oxygen  Post-op Assessment: Report given to PACU RN and Post -op Vital signs reviewed and stable  Post vital signs: stable  Complications: No apparent anesthesia complications

## 2013-01-13 ENCOUNTER — Encounter (HOSPITAL_COMMUNITY): Payer: Self-pay | Admitting: Obstetrics and Gynecology

## 2013-01-13 DIAGNOSIS — N189 Chronic kidney disease, unspecified: Secondary | ICD-10-CM

## 2013-01-13 DIAGNOSIS — N179 Acute kidney failure, unspecified: Secondary | ICD-10-CM | POA: Diagnosis present

## 2013-01-13 LAB — BASIC METABOLIC PANEL
Calcium: 8.7 mg/dL (ref 8.4–10.5)
Creatinine, Ser: 1.72 mg/dL — ABNORMAL HIGH (ref 0.50–1.10)
GFR calc Af Amer: 31 mL/min — ABNORMAL LOW (ref >90)
GFR calc non Af Amer: 26 mL/min — ABNORMAL LOW (ref >90)
Sodium: 135 mEq/L (ref 135–145)

## 2013-01-13 LAB — CBC
MCH: 30.5 pg (ref 26.0–34.0)
MCHC: 33.4 g/dL (ref 30.0–36.0)
MCV: 91.4 fL (ref 78.0–100.0)
Platelets: 142 10*3/uL — ABNORMAL LOW (ref 150–400)
RBC: 3.83 MIL/uL — ABNORMAL LOW (ref 3.87–5.11)
RDW: 14.3 % (ref 11.5–15.5)

## 2013-01-13 MED FILL — Heparin Sodium (Porcine) Inj 5000 Unit/ML: INTRAMUSCULAR | Qty: 1 | Status: AC

## 2013-01-13 NOTE — Progress Notes (Signed)
Subjective: Patient reports nausea, vomiting, tolerating PO, + flatus and no problems voiding.    Objective: I have reviewed patient's vital signs, intake and output, medications and labs.  General: alert and cooperative CV rrr Lungs clear to auscultation no rrw Abd: soft appropriately tender nondistended +BS Ext no edema    Assessment/Plan: POD #1 s/p lavh/ bso/ anterior repair and cystoscopy Doing well from surgical perspective with return of bowel and bladder function.  however increased creatinine Pt has known renal insufficiency however due to increase will consult hospitalits for any additional management recommendations. Plan repeat bmet in am. Probable d/c home tomorrow// Dr. Dion Body covering until 1700 today and Dr. Christell Constant covering this evening.    LOS: 1 day    Chelsey Campbell J. 01/13/2013, 12:26 PM

## 2013-01-13 NOTE — Consult Note (Signed)
[facility] Medical Consultation  [name]:[id number] DOB: [birth year] DOA: 01/12/2013 PCP: [name], MD   Requesting physician: Dr.Varnado  Reason for consultation: rise in BUN/creatinine  Impression/Recommendations  1. AKI on CKD 3 -slight bump in BUN and creatinine is likely due to hypotension overnight, along with concomitant ARB(Avapro) use. - Volume status is pretty even, clinically do not think she needs more IVF at this point. - Strict I/Os -Hold HCTZ and ARB(Avapro) -FU bmet in am - IF creatinine does not continue to trend up can be Dced home to resume ARB in 2-3 days  - FU CBC in am with ongoing intermittent vaginal blood loss.    Thank you for this consultation, will follow    HPI: 84/F s/p Vaginal hysterectomy yetserday for prolapsed uterus yesterday, she has multiple medical problems notable for CAD, CKD, HTN, AS, LV dysfunction EF of 45-50%. She had mild crackles overnight and received a one time dose of IV lasix, in addition her BP dropped in the 90s several times overnight, she also had a CXR which showed atelectasis. She denies any complaints at this time except for some abdominal discomfort and vaginal bleeding post op.   Review of Systems:  12 system review negative expect for abdominal discomfort and some vaginal bleeding postop  Past Medical History  Diagnosis Date  . Hypertension   . Hyperlipidemia   . Insomnia   . Overweight   . Vaginal prolapse     with pesary  . S/P cardiac pacemaker procedure November 2011    for tachy/brady syndrome  . Peripheral vascular disease     S/P  femoral bypass  . History of TIA (transient ischemic attack) 2011    TIA episode without an actual TIA   . Bursitis     in joints - hips/neck  . Aortic stenosis, severe   . Ventricular dysfunction     Left mild with EF 45-50%  . CHF (congestive heart failure)   . COPD (chronic obstructive pulmonary disease)   . SVD (spontaneous vaginal delivery)      x 3  . Renal insufficiency     borderline - r/t bp - Dr Briant Cedar at [facility]  . Pacemaker     [employer]  . Shortness of breath     occasionally with exertion  . Heart murmur   . GERD (gastroesophageal reflux disease)   . Anemia     history   Past Surgical History  Procedure Laterality Date  . Eye surgery       Bilateral Cataracts  . Coronary artery bypass graft  2013    replaced aortic valve  . Pad  2009    vein right arm to left leg   . Wrist surgery  2005    fell broke both wrists and had reconstrictive surgery on both wrists  . Cardiac valve replacement  2013    aorta replacement w/ stent  . Insert / replace / remove pacemaker  2011    [employer]  . Laparoscopic assisted vaginal hysterectomy N/A 01/12/2013    Procedure: LAPAROSCOPIC ASSISTED VAGINAL HYSTERECTOMY;  Surgeon: [name]. Richardson Dopp, MD;  Location: [facility];  Service: Gynecology;  Laterality: N/A;  . Salpingoophorectomy Bilateral 01/12/2013    Procedure: SALPINGO OOPHORECTOMY;  Surgeon: [name]. Richardson Dopp, MD;  Location: [facility];  Service: Gynecology;  Laterality: Bilateral;  . Cystocele repair N/A 01/12/2013    Procedure: ANTERIOR REPAIR (CYSTOCELE);  Surgeon: [name]. Richardson Dopp, MD;  Location: [facility];  Service: Gynecology;  Laterality: N/A;  . Cystoscopy N/A 01/12/2013    Procedure: CYSTOSCOPY;  Surgeon: [name]. Richardson Dopp, MD;  Location: [facility];  Service: Gynecology;  Laterality: N/A;   Social History:  reports that she quit smoking about 2 years ago. Her smoking use included Cigarettes. She has a 60 pack-year smoking history. She has never used smokeless tobacco. She reports that she does not drink alcohol or use illicit drugs.  Allergies  Allergen Reactions  . Procaine Hcl Anaphylaxis  . Morphine And Related Other (See Comments)    Patient gets disoriented/lost memory and doesn't tolerate it well.  Patient does not want morphine used.  . Ciprofloxacin Other (See Comments)    Infusion site reaction  . Labetalol  Hcl   . Penicillins Rash   Family History  Problem Relation Age of Onset  . Heart disease Father   . Cancer Sister     brain    Prior to Admission medications   Medication Sig Start Date End Date Taking? Authorizing Provider  albuterol (PROVENTIL HFA;VENTOLIN HFA) 108 (90 BASE) MCG/ACT inhaler Inhale 2 puffs into the lungs every 6 (six) hours as needed for wheezing or shortness of breath. Used at night   Yes Historical Provider, MD  AMLODIPINE BESYLATE PO Take 2.5 mg by mouth daily.    Yes Historical Provider, MD  aspirin 81 MG tablet Take 81 mg by mouth daily.   Yes Historical Provider, MD  B Complex Vitamins (VITAMIN B COMPLEX PO) Take 1 tablet by mouth daily.   Yes Historical Provider, MD  hydrochlorothiazide (HYDRODIURIL) 25 MG tablet Take 25 mg by mouth daily.   Yes Historical Provider, MD  METOPROLOL TARTRATE PO Take 100 mg by mouth 2 (two) times daily.    Yes Historical Provider, MD  nortriptyline (PAMELOR) 10 MG capsule Take 10 mg by mouth at bedtime.   Yes Historical Provider, MD  OXYQUINOLONE SULFATE VAGINAL (TRIMO-SAN) 0.025 % GEL Place 0.025 g vaginally 3 (three) times a week.   Yes Historical Provider, MD  ranitidine (ZANTAC) 150 MG tablet Take 150 mg by mouth as needed for heartburn.   Yes Historical Provider, MD  simvastatin (ZOCOR) 10 MG tablet Take 20 mg by mouth at bedtime.   Yes Historical Provider, MD  telmisartan (MICARDIS) 80 MG tablet Take 80 mg by mouth daily.   Yes Historical Provider, MD  tiotropium (SPIRIVA) 18 MCG inhalation capsule Place 18 mcg into inhaler and inhale daily.   Yes Historical Provider, MD   Physical Exam: Blood pressure 134/58, pulse 57, temperature 97.3 F (36.3 C), temperature source Oral, resp. rate 16, height 4\' 11"  (1.499 m), weight 62.778 kg (138 lb 6.4 oz), SpO2 96.00%. Filed Vitals:   01/13/13 1135 01/13/13 1150 01/13/13 1227 01/13/13 1356  BP:  120/63  134/58  Pulse: 60 59 47 57  Temp:  97.4 F (36.3 C)  97.3 F (36.3 C)   TempSrc:  Oral  Oral  Resp: 15 15 15 16   Height:      Weight:      SpO2: 97% 95% 96% 96%     General:  AAOx3, no distress  HEENT: PERRLA, EOmi, no JVD  Cardiovascular: S1S2/RRR, systolic murmur  Respiratory: CTAB  Abdomen: soft, mild lower abd tenderness as expected, BS present  Ext: no edema/c/c  Psychiatric: appropriate mood and affect  Neurologic: non focal  Labs on Admission:  Basic Metabolic Panel:  Recent Labs Lab 01/13/13 0524  NA 135  K 5.0  CL 100  CO2 27  GLUCOSE 193*  BUN 39*  CREATININE 1.72*  CALCIUM 8.7   Liver Function Tests: No results found for this basename: AST, ALT, ALKPHOS, BILITOT, PROT, ALBUMIN,  in the last 168 hours No results found for this basename: LIPASE, AMYLASE,  in the last 168 hours No results found for this basename: AMMONIA,  in the last 168 hours CBC:  Recent Labs Lab 01/13/13 0524  WBC 17.7*  HGB 11.7*  HCT 35.0*  MCV 91.4  PLT 142*   Cardiac Enzymes: No results found for this basename: CKTOTAL, CKMB, CKMBINDEX, TROPONINI,  in the last 168 hours BNP: No components found with this basename: POCBNP,  CBG: No results found for this basename: GLUCAP,  in the last 168 hours  Radiological Exams on Admission: Dg Chest Port 1 View  01/12/2013  *RADIOLOGY REPORT*  Clinical Data: Shortness of breath.  Status post hysterectomy, oophorectomy and cystocele repair.  PORTABLE CHEST - 1 VIEW  Comparison: None  Findings: Upper limits normal heart size noted with evidence of cardiac valve replacement. A left-sided pacemaker is identified. Minimal left basilar atelectasis/scarring is noted. There is no evidence of focal airspace disease, pulmonary edema, suspicious pulmonary nodule/mass, pleural effusion, or pneumothorax. No acute bony abnormalities are identified.  IMPRESSION: Minimal left basilar atelectasis/scarring.  Upper limits normal heart size.   Original Report Authenticated By: Harmon Pier, M.D.      Time spent:   [facility] Pager [phone]  If 7PM-7AM, please contact night-coverage [email or web address] Password [facility] 01/13/2013, 2:58 PM

## 2013-01-13 NOTE — Anesthesia Postprocedure Evaluation (Signed)
  Anesthesia Post-op Note  Patient: Chelsey Campbell  Procedure(s) Performed: Procedure(s): LAPAROSCOPIC ASSISTED VAGINAL HYSTERECTOMY (N/A) SALPINGO OOPHORECTOMY (Bilateral) ANTERIOR REPAIR (CYSTOCELE) (N/A) CYSTOSCOPY (N/A)  Patient Location: AICU Anesthesia Type:General  Level of Consciousness: awake, alert , oriented and patient cooperative  Airway and Oxygen Therapy: Patient Spontanous Breathing and Patient connected to nasal cannula oxygen  Post-op Pain: none  Post-op Assessment: Patient's Cardiovascular Status Stable, Respiratory Function Stable, Patent Airway, No signs of Nausea or vomiting, Adequate PO intake and Pain level controlled  Post-op Vital Signs: Reviewed and stable  Complications: No apparent anesthesia complications

## 2013-01-13 NOTE — Progress Notes (Signed)
Pt transferred to Rm #[id number] ambulatory -  Report updated to Salena Saner, RN

## 2013-01-14 LAB — CBC
MCH: 30.8 pg (ref 26.0–34.0)
MCHC: 33.2 g/dL (ref 30.0–36.0)
MCV: 92.7 fL (ref 78.0–100.0)
Platelets: 154 10*3/uL (ref 150–400)
RDW: 14.8 % (ref 11.5–15.5)

## 2013-01-14 LAB — BASIC METABOLIC PANEL
BUN: 39 mg/dL — ABNORMAL HIGH (ref 6–23)
CO2: 26 mEq/L (ref 19–32)
Calcium: 9.2 mg/dL (ref 8.4–10.5)
Creatinine, Ser: 1.61 mg/dL — ABNORMAL HIGH (ref 0.50–1.10)
Glucose, Bld: 115 mg/dL — ABNORMAL HIGH (ref 70–99)

## 2013-01-14 MED ORDER — OXYCODONE-ACETAMINOPHEN 5-325 MG PO TABS: 1.0000 | ORAL_TABLET | Freq: Four times a day (QID) | ORAL | Status: DC | PRN

## 2013-01-14 NOTE — Discharge Summary (Signed)
Physician Discharge Summary  Patient ID: Chelsey Campbell MRN: [id number] DOB/AGE: July 09, 1928 79 y.o.  Admit date: 01/12/2013 Discharge date: 01/14/2013  Admission Diagnoses: uterine prolapse, cystocele, complex adnexal cyst   Discharge Diagnoses: Same  Active Problems:   AKI (acute kidney injury)   Chronic kidney disease   Discharged Condition: stable  Hospital Course: PT ws admitted for observation after lavh/ bso/ anterior repair. She has known renal insufficiency and creatinine increased to 1.7 aftter surgery on pod #2 it was trending down to 1.6. Pt did well postoperative with return of bowel and bladder function   Consults: [facility]   Significant Diagnostic Studies: labs: Creatinine on 01/14/2013 1.6  Treatments: IV hydration and surgery: LAVH/ BSO/Anterior repair   Discharge Exam: Blood pressure 163/79, pulse 58, temperature 97.5 F (36.4 C), temperature source Oral, resp. rate 16, height 4\' 11"  (1.499 m), weight 62.778 kg (138 lb 6.4 oz), SpO2 95.00%.   Disposition: Final discharge disposition not confirmed  Discharge Orders   Future Appointments Provider Department Dept Phone   05/10/2013 1:30 PM [facility] [phone]   05/10/2013 2:00 PM [facility] [phone]   05/10/2013 3:00 PM Evern Bio, NP [facility] [phone]   Future Orders Complete By Expires     Call MD for:  persistant nausea and vomiting  As directed     Call MD for:  redness, tenderness, or signs of infection (pain, swelling, redness, odor or green/yellow discharge around incision site)  As directed     Call MD for:  severe uncontrolled pain  As directed     Call MD for:  temperature >100.4  As directed     Diet - low sodium heart healthy  As directed     Driving Restrictions  As directed     Comments:      Avoid driving for 2 weeks    Increase activity slowly  As  directed     Lifting restrictions  As directed     Comments:      Do not lift over 10 lbs    Sexual Activity Restrictions  As directed     Comments:      avolid sex        Medication List    TAKE these medications       albuterol 108 (90 BASE) MCG/ACT inhaler  Commonly known as:  PROVENTIL HFA;VENTOLIN HFA  Inhale 2 puffs into the lungs every 6 (six) hours as needed for wheezing or shortness of breath. Used at night     AMLODIPINE BESYLATE PO  Take 2.5 mg by mouth daily.     aspirin 81 MG tablet  Take 81 mg by mouth daily.     hydrochlorothiazide 25 MG tablet  Commonly known as:  HYDRODIURIL  Take 25 mg by mouth daily.     METOPROLOL TARTRATE PO  Take 100 mg by mouth 2 (two) times daily.     nortriptyline 10 MG capsule  Commonly known as:  PAMELOR  Take 10 mg by mouth at bedtime.     oxyCODONE-acetaminophen 5-325 MG per tablet  Commonly known as:  PERCOCET/ROXICET  Take 1-2 tablets by mouth every 6 (six) hours as needed.     ranitidine 150 MG tablet  Commonly known as:  ZANTAC  Take 150 mg by mouth as needed for heartburn.     simvastatin 10 MG tablet  Commonly known as:  ZOCOR  Take 20 mg by mouth at bedtime.     telmisartan 80 MG tablet  Commonly known as:  MICARDIS  Take 80 mg by mouth daily.     tiotropium 18 MCG inhalation capsule  Commonly known as:  SPIRIVA  Place 18 mcg into inhaler and inhale daily.     TRIMO-SAN 0.025 % Gel  Generic drug:  OXYQUINOLONE SULFATE VAGINAL  Place 0.025 g vaginally 3 (three) times a week.     VITAMIN B COMPLEX PO  Take 1 tablet by mouth daily.           Follow-up Information   Follow up with Jessee Avers., MD In 2 weeks. (pt may already have an appointment )    Contact information:   [address] [place] [address] [phone]       Signed: Jessee Avers. 01/14/2013, 8:39 AM

## 2013-01-14 NOTE — Progress Notes (Signed)
Subjective: Patient reports tolerating PO, + flatus and no problems voiding.  She denies chest pain and sob   Objective: I have reviewed patient's vital signs, intake and output, medications and labs.  General: alert and cooperative Resp: clear to auscultation bilaterally Cardio: regular rate and rhythm, S1, S2 normal, no murmur, click, rub or gallop GI: incision: clean, dry and intact Extremities: extremities normal, atraumatic, no cyanosis or edema   Assessment/Plan: POD #1 s/p LAVH/BSO/ Anterior repair doing well.  Renal insufficiency . Creatinine is improving and trending down.  D/C home today with follow up in the office in 2 weeks    LOS: 2 days    Cecil Vandyke J. 01/14/2013, 8:31 AM

## 2013-05-10 ENCOUNTER — Ambulatory Visit: Payer: Medicare Other | Admitting: Neurosurgery

## 2013-06-01 ENCOUNTER — Other Ambulatory Visit: Payer: Self-pay | Admitting: *Deleted

## 2013-06-01 DIAGNOSIS — Z48812 Encounter for surgical aftercare following surgery on the circulatory system: Secondary | ICD-10-CM

## 2013-06-01 DIAGNOSIS — I739 Peripheral vascular disease, unspecified: Secondary | ICD-10-CM

## 2013-06-17 ENCOUNTER — Encounter: Payer: Self-pay | Admitting: Surgery

## 2013-06-20 ENCOUNTER — Ambulatory Visit (INDEPENDENT_AMBULATORY_CARE_PROVIDER_SITE_OTHER): Payer: Medicare Other | Admitting: Surgery

## 2013-06-20 ENCOUNTER — Encounter (INDEPENDENT_AMBULATORY_CARE_PROVIDER_SITE_OTHER): Payer: Medicare Other | Admitting: *Deleted

## 2013-06-20 ENCOUNTER — Other Ambulatory Visit (INDEPENDENT_AMBULATORY_CARE_PROVIDER_SITE_OTHER): Payer: Medicare Other | Admitting: *Deleted

## 2013-06-20 ENCOUNTER — Encounter: Payer: Self-pay | Admitting: Surgery

## 2013-06-20 VITALS — BP 142/78 | HR 59 | Resp 16 | Ht <= 58 in | Wt 142.0 lb

## 2013-06-20 DIAGNOSIS — I739 Peripheral vascular disease, unspecified: Secondary | ICD-10-CM | POA: Insufficient documentation

## 2013-06-20 DIAGNOSIS — Z48812 Encounter for surgical aftercare following surgery on the circulatory system: Secondary | ICD-10-CM

## 2013-06-20 DIAGNOSIS — I6529 Occlusion and stenosis of unspecified carotid artery: Secondary | ICD-10-CM

## 2013-06-20 NOTE — Progress Notes (Signed)
[facility]   Patient name: Chelsey Campbell MRN: [id number] DOB: 06/27/1928 Sex: female     Chief Complaint  Patient presents with  . PVD    One year F/up with vascular lab study.  . Carotid    HISTORY OF PRESENT ILLNESS: This is a 79 year old female that I am asked to see and evaluate for peripheral vascular disease. She is status post left leg bypass graft in 2009 in [place] by Dr. Craige Cotta. According to the patient this was done for acute ischemia. I do not have any records available. She had a vein harvested from her right arm. She has no lower extremity complaints today. She denies claudication. She denies ulceration. She denies rest pain. She also denies neurologic symptoms. She states that she has had issues with her left carotid on prior studies in the past.   Past Medical History  Diagnosis Date  . Hypertension   . Hyperlipidemia   . Insomnia   . Overweight(278.02)   . Vaginal prolapse     with pesary  . S/P cardiac pacemaker procedure November 2011    for tachy/brady syndrome  . Peripheral vascular disease     S/P  femoral bypass  . History of TIA (transient ischemic attack) 2011    TIA episode without an actual TIA   . Bursitis     in joints - hips/neck  . Aortic stenosis, severe   . Ventricular dysfunction     Left mild with EF 45-50%  . CHF (congestive heart failure)   . COPD (chronic obstructive pulmonary disease)   . SVD (spontaneous vaginal delivery)     x 3  . Renal insufficiency     borderline - r/t bp - Dr Briant Cedar at [facility]  . Pacemaker     [employer]  . Shortness of breath     occasionally with exertion  . Heart murmur   . GERD (gastroesophageal reflux disease)   . Anemia     history    Past Surgical History  Procedure Laterality Date  . Eye surgery       Bilateral Cataracts  . Coronary artery bypass graft  2013    replaced aortic valve  . Pad  2009    vein right arm to left leg   . Wrist  surgery  2005    fell broke both wrists and had reconstrictive surgery on both wrists  . Cardiac valve replacement  2013    aorta replacement w/ stent  . Insert / replace / remove pacemaker  2011    [employer]  . Laparoscopic assisted vaginal hysterectomy N/A 01/12/2013    Procedure: LAPAROSCOPIC ASSISTED VAGINAL HYSTERECTOMY;  Surgeon: [name]. Richardson Dopp, MD;  Location: [facility];  Service: Gynecology;  Laterality: N/A;  . Salpingoophorectomy Bilateral 01/12/2013    Procedure: SALPINGO OOPHORECTOMY;  Surgeon: [name]. Richardson Dopp, MD;  Location: [facility];  Service: Gynecology;  Laterality: Bilateral;  . Cystocele repair N/A 01/12/2013    Procedure: ANTERIOR REPAIR (CYSTOCELE);  Surgeon: [name]. Richardson Dopp, MD;  Location: [facility];  Service: Gynecology;  Laterality: N/A;  . Cystoscopy N/A 01/12/2013    Procedure: CYSTOSCOPY;  Surgeon: [name]. Richardson Dopp, MD;  Location: [facility];  Service: Gynecology;  Laterality: N/A;  . Abdominal hysterectomy  01-12-13    History   Social History  . Marital Status: Widowed    Spouse Name: N/A    Number of Children: N/A  . Years of Education: N/A  Occupational History  . Not on file.   Social History Main Topics  . Smoking status: Former Smoker -- 1.00 packs/day for 60 years    Types: Cigarettes    Quit date: 10/11/2010  . Smokeless tobacco: Never Used  . Alcohol Use: No  . Drug Use: No  . Sexually Active: No   Other Topics Concern  . Not on file   Social History Narrative  . No narrative on file    Family History  Problem Relation Age of Onset  . Heart disease Father   . Heart attack Father 78  . Cancer Sister     brain    Allergies as of 06/20/2013 - Review Complete 06/20/2013  Allergen Reaction Noted  . Procaine hcl Anaphylaxis 10/21/2010  . Morphine and related Other (See Comments) 01/05/2013  . Ciprofloxacin Other (See Comments) 01/12/2013  . Labetalol hcl  12/31/2012  . Penicillins Rash 10/21/2010    Current Outpatient Prescriptions on File Prior  to Visit  Medication Sig Dispense Refill  . albuterol (PROVENTIL HFA;VENTOLIN HFA) 108 (90 BASE) MCG/ACT inhaler Inhale 2 puffs into the lungs every 6 (six) hours as needed for wheezing or shortness of breath. Used at night      . AMLODIPINE BESYLATE PO Take 2.5 mg by mouth daily.       Marland Kitchen aspirin 81 MG tablet Take 81 mg by mouth daily.      . B Complex Vitamins (VITAMIN B COMPLEX PO) Take 1 tablet by mouth daily.      Marland Kitchen METOPROLOL TARTRATE PO Take 100 mg by mouth 2 (two) times daily.       . nortriptyline (PAMELOR) 10 MG capsule Take 10 mg by mouth at bedtime.      . ranitidine (ZANTAC) 150 MG tablet Take 150 mg by mouth as needed for heartburn.      . simvastatin (ZOCOR) 10 MG tablet Take 20 mg by mouth at bedtime.      Marland Kitchen tiotropium (SPIRIVA) 18 MCG inhalation capsule Place 18 mcg into inhaler and inhale daily.      . hydrochlorothiazide (HYDRODIURIL) 25 MG tablet Take 25 mg by mouth daily.      Marland Kitchen oxyCODONE-acetaminophen (PERCOCET/ROXICET) 5-325 MG per tablet Take 1-2 tablets by mouth every 6 (six) hours as needed.  30 tablet  0  . OXYQUINOLONE SULFATE VAGINAL (TRIMO-SAN) 0.025 % GEL Place 0.025 g vaginally 3 (three) times a week.      . telmisartan (MICARDIS) 80 MG tablet Take 80 mg by mouth daily.       No current facility-administered medications on file prior to visit.     REVIEW OF SYSTEMS: No changes from prior visit  PHYSICAL EXAMINATION:   Vital signs are BP 142/78  Pulse 59  Resp 16  Ht 4\' 10"  (1.473 m)  Wt 142 lb (64.411 kg)  BMI 29.69 kg/m2  SpO2 97% General: The patient appears their stated age. HEENT:  No gross abnormalities Pulmonary:  Non labored breathing Musculoskeletal: There are no major deformities. Neurologic: No focal weakness or paresthesias are detected, Skin: There are no ulcer or rashes noted. Psychiatric: The patient has normal affect. Cardiovascular: There is a regular rate and rhythm without significant murmur appreciated. No carotid bruits.  Palpable pedal pulse on the left   Diagnostic Studies Carotid duplex was ordered and reviewed. This shows less than 40% bilateral carotid stenosis however there is an area of approximately 1 cm in length on the left carotid is very calcified where visualization  was limited.  Lower Jimmy arterial duplex was evaluated. She has an ABI of 0.9 on the right and 1.01 on the left. The bypass graft is widely patent without evidence of stenosis.  Assessment: Status post left leg bypass and carotid occlusive disease Plan: The patient's disease is very stable. She'll followup in one year with a repeat surveillance ultrasound of her carotid and left leg bypass  V. Charlena Cross, M.D. [facility] [place] Office: [phone] Pager:  [phone]

## 2013-06-20 NOTE — Addendum Note (Signed)
Addended by: Adria Dill L on: 06/20/2013 05:01 PM   Modules accepted: Orders

## 2014-01-03 ENCOUNTER — Other Ambulatory Visit: Payer: Self-pay | Admitting: Surgery

## 2014-01-03 DIAGNOSIS — Z48812 Encounter for surgical aftercare following surgery on the circulatory system: Secondary | ICD-10-CM

## 2014-01-03 DIAGNOSIS — I739 Peripheral vascular disease, unspecified: Secondary | ICD-10-CM

## 2014-01-03 DIAGNOSIS — I6529 Occlusion and stenosis of unspecified carotid artery: Secondary | ICD-10-CM

## 2014-04-14 ENCOUNTER — Other Ambulatory Visit: Payer: Self-pay | Admitting: Surgery

## 2014-04-14 ENCOUNTER — Ambulatory Visit (HOSPITAL_COMMUNITY)
Admission: RE | Admit: 2014-04-14 | Discharge: 2014-04-14 | Disposition: A | Discharge status: Home / Self Care | Payer: Medicare Other | Source: Ambulatory Visit | Attending: Surgery | Admitting: Surgery

## 2014-04-14 ENCOUNTER — Ambulatory Visit (INDEPENDENT_AMBULATORY_CARE_PROVIDER_SITE_OTHER)
Admission: RE | Admit: 2014-04-14 | Discharge: 2014-04-14 | Disposition: A | Discharge status: Home / Self Care | Payer: Medicare Other | Source: Ambulatory Visit | Attending: Surgery | Admitting: Surgery

## 2014-04-14 ENCOUNTER — Encounter: Payer: Self-pay | Admitting: Surgery

## 2014-04-14 DIAGNOSIS — Z48812 Encounter for surgical aftercare following surgery on the circulatory system: Secondary | ICD-10-CM

## 2014-04-14 DIAGNOSIS — I739 Peripheral vascular disease, unspecified: Secondary | ICD-10-CM

## 2014-04-14 DIAGNOSIS — I6529 Occlusion and stenosis of unspecified carotid artery: Secondary | ICD-10-CM

## 2014-04-17 ENCOUNTER — Ambulatory Visit (INDEPENDENT_AMBULATORY_CARE_PROVIDER_SITE_OTHER): Payer: Medicare Other | Admitting: Surgery

## 2014-04-17 ENCOUNTER — Encounter: Payer: Self-pay | Admitting: Surgery

## 2014-04-17 VITALS — BP 151/76 | HR 60 | Ht <= 58 in | Wt 146.0 lb

## 2014-04-17 DIAGNOSIS — I6529 Occlusion and stenosis of unspecified carotid artery: Secondary | ICD-10-CM | POA: Diagnosis not present

## 2014-04-17 DIAGNOSIS — I6523 Occlusion and stenosis of bilateral carotid arteries: Secondary | ICD-10-CM

## 2014-04-17 DIAGNOSIS — I739 Peripheral vascular disease, unspecified: Secondary | ICD-10-CM

## 2014-04-17 NOTE — Progress Notes (Signed)
Patient name: Chelsey Campbell MRN: [id number] DOB: [birth year] Sex: female     Chief Complaint  Patient presents with  . Re-evaluation    c/o bilateral hammer toes w/ pain     HISTORY OF PRESENT ILLNESS: This is a 80 year old female that I am asked to see and evaluate for peripheral vascular disease. She is status post left leg bypass graft in 2009 in [place] by Dr. Baltazar Najjar. According to the patient this was done for acute ischemia. I do not have any records available. She had a vein harvested from her right arm.  At her feet hurt her all the time and that she is here today for recheck of her blood flow so that she can have foot surgery to correct hammertoe and calyxes. She denies claudication. She denies ulceration. She denies rest pain. She also denies neurologic symptoms. She states that she has had issues with her left carotid on prior studies in the past.      Past Medical History  Diagnosis Date  . Hypertension   . Hyperlipidemia   . Insomnia   . Overweight   . Vaginal prolapse     with pesary  . S/P cardiac pacemaker procedure November 2011    for tachy/brady syndrome  . Peripheral vascular disease     S/P  femoral bypass  . History of TIA (transient ischemic attack) 2011    TIA episode without an actual TIA   . Bursitis     in joints - hips/neck  . Aortic stenosis, severe   . Ventricular dysfunction     Left mild with EF 45-50%  . CHF (congestive heart failure)   . COPD (chronic obstructive pulmonary disease)   . SVD (spontaneous vaginal delivery)     x 3  . Renal insufficiency     borderline - r/t bp - Dr Mercy Moore at [facility]  . Pacemaker     [employer]  . Shortness of breath     occasionally with exertion  . Heart murmur   . GERD (gastroesophageal reflux disease)   . Anemia     history  . CAD (coronary artery disease)   . Atrial fibrillation     Past Surgical History  Procedure Laterality Date  . Eye surgery       Bilateral  Cataracts  . Coronary artery bypass graft  2013    replaced aortic valve  . Pad  2009    vein right arm to left leg   . Wrist surgery  2005    fell broke both wrists and had reconstrictive surgery on both wrists  . Cardiac valve replacement  2013    aorta replacement w/ stent  . Insert / replace / remove pacemaker  2011    [employer]  . Laparoscopic assisted vaginal hysterectomy N/A 01/12/2013    Procedure: LAPAROSCOPIC ASSISTED VAGINAL HYSTERECTOMY;  Surgeon: Maeola Sarah. Landry Mellow, MD;  Location: [facility];  Service: Gynecology;  Laterality: N/A;  . Salpingoophorectomy Bilateral 01/12/2013    Procedure: SALPINGO OOPHORECTOMY;  Surgeon: Maeola Sarah. Landry Mellow, MD;  Location: [facility];  Service: Gynecology;  Laterality: Bilateral;  . Cystocele repair N/A 01/12/2013    Procedure: ANTERIOR REPAIR (CYSTOCELE);  Surgeon: Maeola Sarah. Landry Mellow, MD;  Location: [facility];  Service: Gynecology;  Laterality: N/A;  . Cystoscopy N/A 01/12/2013    Procedure: CYSTOSCOPY;  Surgeon: Maeola Sarah. Landry Mellow, MD;  Location: [facility];  Service: Gynecology;  Laterality: N/A;  . Abdominal hysterectomy  01-12-13  History   Social History  . Marital Status: Widowed    Spouse Name: N/A    Number of Children: N/A  . Years of Education: N/A   Occupational History  . Not on file.   Social History Main Topics  . Smoking status: Former Smoker -- 1.00 packs/day for 60 years    Types: Cigarettes    Quit date: 10/11/2010  . Smokeless tobacco: Never Used  . Alcohol Use: No  . Drug Use: No  . Sexual Activity: No   Other Topics Concern  . Not on file   Social History Narrative  . No narrative on file    Family History  Problem Relation Age of Onset  . Heart disease Father   . Heart attack Father 83  . Diabetes Father   . Cancer Sister     brain  . Heart disease Brother   . Diabetes Daughter   . Hyperlipidemia Son     Allergies as of 04/17/2014 - Review Complete 04/17/2014  Allergen Reaction Noted  . Procaine hcl Anaphylaxis  10/21/2010  . Morphine and related Other (See Comments) 01/05/2013  . Ciprofloxacin Other (See Comments) 01/12/2013  . Labetalol hcl  12/31/2012  . Penicillins Rash 10/21/2010    Current Outpatient Prescriptions on File Prior to Visit  Medication Sig Dispense Refill  . albuterol (PROVENTIL HFA;VENTOLIN HFA) 108 (90 BASE) MCG/ACT inhaler Inhale 2 puffs into the lungs every 6 (six) hours as needed for wheezing or shortness of breath. Used at night      . AMLODIPINE BESYLATE PO Take 2.5 mg by mouth daily.       Marland Kitchen aspirin 81 MG tablet Take 81 mg by mouth daily.      . B Complex Vitamins (VITAMIN B COMPLEX PO) Take 1 tablet by mouth daily.      Marland Kitchen losartan-hydrochlorothiazide (HYZAAR) 100-25 MG per tablet daily.      Marland Kitchen METOPROLOL TARTRATE PO Take 100 mg by mouth 2 (two) times daily.       . nortriptyline (PAMELOR) 10 MG capsule Take 10 mg by mouth at bedtime.      . simvastatin (ZOCOR) 10 MG tablet Take 20 mg by mouth at bedtime.      Marland Kitchen tiotropium (SPIRIVA) 18 MCG inhalation capsule Place 18 mcg into inhaler and inhale daily.      . hydrochlorothiazide (HYDRODIURIL) 25 MG tablet Take 25 mg by mouth daily.      Marland Kitchen oxyCODONE-acetaminophen (PERCOCET/ROXICET) 5-325 MG per tablet Take 1-2 tablets by mouth every 6 (six) hours as needed.  30 tablet  0  . OXYQUINOLONE SULFATE VAGINAL (TRIMO-SAN) 0.025 % GEL Place 0.025 g vaginally 3 (three) times a week.      . ranitidine (ZANTAC) 150 MG tablet Take 150 mg by mouth as needed for heartburn.      . telmisartan (MICARDIS) 80 MG tablet Take 80 mg by mouth daily.       No current facility-administered medications on file prior to visit.     REVIEW OF SYSTEMS: 3 see history of present illness, otherwise all systems are negative  PHYSICAL EXAMINATION:   Vital signs are BP 151/76  Pulse 60  Ht 4\' 10"  (1.473 m)  Wt 146 lb (66.225 kg)  BMI 30.52 kg/m2  SpO2 98% General: The patient appears their stated age. HEENT:  No gross abnormalities Pulmonary:   Non labored breathing Musculoskeletal: There are no major deformities.  The second toe hammer toe with callus on metatarsal head of great  toe bilaterally Neurologic: No focal weakness or paresthesias are detected, Skin: There are no ulcer or rashes noted. Psychiatric: The patient has normal affect. Cardiovascular: There is a regular rate and rhythm without significant murmur appreciated.  I cannot palpate pedal pulses   Diagnostic Studies Ultrasound studies have been reviewed and ordered.  This shows less than 40% bilateral carotid stenosis which is unchanged.  There is an area on the left carotid which is difficult to visualize given calcification.  Ankle-brachial index on the right is 0.94 with triphasic waveforms.  On the left is 0.98 with triphasic waveforms.  Bypass graft appears to be widely patent.  The patient has a toe pressure on the right is 66 and on the left of 91  Assessment: #1: Peripheral vascular disease, status post bypass graft in [facility] #2 colon carotid occlusive disease, asymptomatic Plan: #1: The patient is scheduled for foot surgery in the near future.  Her bypass graft is widely patent.  She has triphasic waveforms at the ankle with normal ankle-brachial indices.  However, her toe pressures are diminished.  This suggests vascular disease in the smaller pedal vessels.  Usually, these are not reconstructable.  The right has a toe pressure of 66.  It should be adequate for healing.  I did discuss that she may have some issues with healing, however with a pressure greater than 60 this should be adequate. #2: The patient remains asymptomatic from her carotid disease, however repeat her carotid ultrasound in one year when she comes back for bypass graft surveillance.  Eldridge Abrahams, M.D. [facility] Office: [phone] Pager:  [phone]

## 2014-06-19 ENCOUNTER — Other Ambulatory Visit (HOSPITAL_COMMUNITY): Payer: Medicare Other

## 2014-06-19 ENCOUNTER — Encounter (HOSPITAL_COMMUNITY): Payer: Medicare Other

## 2014-06-19 ENCOUNTER — Ambulatory Visit: Payer: Medicare Other | Admitting: Family

## 2015-04-23 ENCOUNTER — Ambulatory Visit: Payer: Medicare Other | Admitting: Family

## 2015-04-23 ENCOUNTER — Other Ambulatory Visit (HOSPITAL_COMMUNITY): Payer: Medicare Other

## 2015-04-23 ENCOUNTER — Encounter (HOSPITAL_COMMUNITY): Payer: Medicare Other

## 2015-05-01 ENCOUNTER — Encounter: Payer: Self-pay | Admitting: Family

## 2015-05-03 ENCOUNTER — Ambulatory Visit (HOSPITAL_COMMUNITY)
Admission: RE | Admit: 2015-05-03 | Discharge: 2015-05-03 | Disposition: A | Discharge status: Home / Self Care | Payer: Medicare Other | Source: Ambulatory Visit | Attending: Family | Admitting: Family

## 2015-05-03 ENCOUNTER — Ambulatory Visit (INDEPENDENT_AMBULATORY_CARE_PROVIDER_SITE_OTHER)
Admission: RE | Admit: 2015-05-03 | Discharge: 2015-05-03 | Disposition: A | Discharge status: Home / Self Care | Payer: Medicare Other | Source: Ambulatory Visit | Attending: Surgery | Admitting: Surgery

## 2015-05-03 DIAGNOSIS — I739 Peripheral vascular disease, unspecified: Secondary | ICD-10-CM | POA: Diagnosis not present

## 2015-05-03 DIAGNOSIS — I6523 Occlusion and stenosis of bilateral carotid arteries: Secondary | ICD-10-CM | POA: Diagnosis present

## 2015-05-03 DIAGNOSIS — I10 Essential (primary) hypertension: Secondary | ICD-10-CM | POA: Insufficient documentation

## 2015-05-03 DIAGNOSIS — Z87891 Personal history of nicotine dependence: Secondary | ICD-10-CM | POA: Diagnosis not present

## 2015-05-04 ENCOUNTER — Encounter (HOSPITAL_COMMUNITY): Payer: Self-pay | Admitting: *Deleted

## 2015-05-04 NOTE — Progress Notes (Signed)
Pt denies SOB and chest pain but is under the care of Dr. Metta Clines , cardiology at [facility]; please fax peri-op prescription for pacemaker and request cardiac records on DOS ( office is currently closed). Pt stated that she was told to hold Aspirin by MD staff. Pt made aware to stop taking NSAID's , otc vitamins and herbal medications. Pt verbalized understanding of all pre-op instructions.

## 2015-05-05 ENCOUNTER — Ambulatory Visit (HOSPITAL_COMMUNITY): Payer: Medicare Other | Admitting: Anesthesiology

## 2015-05-05 ENCOUNTER — Observation Stay (HOSPITAL_COMMUNITY)
Admission: RE | Admit: 2015-05-05 | Discharge: 2015-05-06 | Disposition: A | Discharge status: Home / Self Care | Payer: Medicare Other | Source: Ambulatory Visit | Attending: Orthopedic Surgery | Admitting: Orthopedic Surgery

## 2015-05-05 ENCOUNTER — Encounter (HOSPITAL_COMMUNITY): Payer: Self-pay | Admitting: *Deleted

## 2015-05-05 ENCOUNTER — Encounter (HOSPITAL_COMMUNITY)
Admission: RE | Disposition: A | Discharge status: Home / Self Care | Payer: Self-pay | Source: Ambulatory Visit | Attending: Orthopedic Surgery

## 2015-05-05 ENCOUNTER — Ambulatory Visit (HOSPITAL_COMMUNITY): Payer: Medicare Other

## 2015-05-05 DIAGNOSIS — K219 Gastro-esophageal reflux disease without esophagitis: Secondary | ICD-10-CM | POA: Diagnosis not present

## 2015-05-05 DIAGNOSIS — Z885 Allergy status to narcotic agent status: Secondary | ICD-10-CM | POA: Insufficient documentation

## 2015-05-05 DIAGNOSIS — S52122A Displaced fracture of head of left radius, initial encounter for closed fracture: Secondary | ICD-10-CM | POA: Insufficient documentation

## 2015-05-05 DIAGNOSIS — I70213 Atherosclerosis of native arteries of extremities with intermittent claudication, bilateral legs: Secondary | ICD-10-CM | POA: Diagnosis not present

## 2015-05-05 DIAGNOSIS — M7061 Trochanteric bursitis, right hip: Secondary | ICD-10-CM | POA: Diagnosis not present

## 2015-05-05 DIAGNOSIS — E785 Hyperlipidemia, unspecified: Secondary | ICD-10-CM | POA: Diagnosis not present

## 2015-05-05 DIAGNOSIS — X58XXXA Exposure to other specified factors, initial encounter: Secondary | ICD-10-CM | POA: Diagnosis not present

## 2015-05-05 DIAGNOSIS — I739 Peripheral vascular disease, unspecified: Secondary | ICD-10-CM | POA: Insufficient documentation

## 2015-05-05 DIAGNOSIS — N179 Acute kidney failure, unspecified: Secondary | ICD-10-CM | POA: Insufficient documentation

## 2015-05-05 DIAGNOSIS — S52023A Displaced fracture of olecranon process without intraarticular extension of unspecified ulna, initial encounter for closed fracture: Secondary | ICD-10-CM | POA: Diagnosis present

## 2015-05-05 DIAGNOSIS — Z881 Allergy status to other antibiotic agents status: Secondary | ICD-10-CM | POA: Diagnosis not present

## 2015-05-05 DIAGNOSIS — I4891 Unspecified atrial fibrillation: Secondary | ICD-10-CM | POA: Diagnosis not present

## 2015-05-05 DIAGNOSIS — I495 Sick sinus syndrome: Secondary | ICD-10-CM | POA: Diagnosis not present

## 2015-05-05 DIAGNOSIS — Z880 Allergy status to penicillin: Secondary | ICD-10-CM | POA: Insufficient documentation

## 2015-05-05 DIAGNOSIS — Y998 Other external cause status: Secondary | ICD-10-CM | POA: Insufficient documentation

## 2015-05-05 DIAGNOSIS — Z888 Allergy status to other drugs, medicaments and biological substances status: Secondary | ICD-10-CM | POA: Insufficient documentation

## 2015-05-05 DIAGNOSIS — I6529 Occlusion and stenosis of unspecified carotid artery: Secondary | ICD-10-CM | POA: Insufficient documentation

## 2015-05-05 DIAGNOSIS — Z87891 Personal history of nicotine dependence: Secondary | ICD-10-CM | POA: Insufficient documentation

## 2015-05-05 DIAGNOSIS — J449 Chronic obstructive pulmonary disease, unspecified: Secondary | ICD-10-CM | POA: Diagnosis not present

## 2015-05-05 DIAGNOSIS — Z8673 Personal history of transient ischemic attack (TIA), and cerebral infarction without residual deficits: Secondary | ICD-10-CM | POA: Diagnosis not present

## 2015-05-05 DIAGNOSIS — I509 Heart failure, unspecified: Secondary | ICD-10-CM | POA: Insufficient documentation

## 2015-05-05 DIAGNOSIS — Y9289 Other specified places as the place of occurrence of the external cause: Secondary | ICD-10-CM | POA: Diagnosis not present

## 2015-05-05 DIAGNOSIS — Y9389 Activity, other specified: Secondary | ICD-10-CM | POA: Diagnosis not present

## 2015-05-05 DIAGNOSIS — N189 Chronic kidney disease, unspecified: Secondary | ICD-10-CM | POA: Insufficient documentation

## 2015-05-05 DIAGNOSIS — I2510 Atherosclerotic heart disease of native coronary artery without angina pectoris: Secondary | ICD-10-CM | POA: Diagnosis not present

## 2015-05-05 DIAGNOSIS — Z950 Presence of cardiac pacemaker: Secondary | ICD-10-CM | POA: Diagnosis not present

## 2015-05-05 DIAGNOSIS — E663 Overweight: Secondary | ICD-10-CM | POA: Insufficient documentation

## 2015-05-05 DIAGNOSIS — R0602 Shortness of breath: Secondary | ICD-10-CM

## 2015-05-05 DIAGNOSIS — S52022A Displaced fracture of olecranon process without intraarticular extension of left ulna, initial encounter for closed fracture: Principal | ICD-10-CM | POA: Insufficient documentation

## 2015-05-05 DIAGNOSIS — Z6829 Body mass index (BMI) 29.0-29.9, adult: Secondary | ICD-10-CM | POA: Insufficient documentation

## 2015-05-05 DIAGNOSIS — M25551 Pain in right hip: Secondary | ICD-10-CM | POA: Insufficient documentation

## 2015-05-05 DIAGNOSIS — S40812A Abrasion of left upper arm, initial encounter: Secondary | ICD-10-CM | POA: Insufficient documentation

## 2015-05-05 DIAGNOSIS — I129 Hypertensive chronic kidney disease with stage 1 through stage 4 chronic kidney disease, or unspecified chronic kidney disease: Secondary | ICD-10-CM | POA: Insufficient documentation

## 2015-05-05 HISTORY — DX: Prediabetes: R73.03

## 2015-05-05 HISTORY — DX: Unspecified osteoarthritis, unspecified site: M19.90

## 2015-05-05 HISTORY — PX: ORIF ULNAR FRACTURE: SHX5417

## 2015-05-05 LAB — POCT I-STAT 4, (NA,K, GLUC, HGB,HCT)
GLUCOSE: 141 mg/dL — AB (ref 65–99)
HCT: 39 % (ref 36.0–46.0)
HEMOGLOBIN: 13.3 g/dL (ref 12.0–15.0)
POTASSIUM: 4.2 mmol/L (ref 3.5–5.1)
Sodium: 137 mmol/L (ref 135–145)

## 2015-05-05 LAB — GLUCOSE, CAPILLARY
GLUCOSE-CAPILLARY: 136 mg/dL — AB (ref 65–99)
Glucose-Capillary: 129 mg/dL — ABNORMAL HIGH (ref 65–99)

## 2015-05-05 SURGERY — OPEN REDUCTION INTERNAL FIXATION (ORIF) ULNAR FRACTURE
Anesthesia: Regional | Site: Elbow | Laterality: Left

## 2015-05-05 MED ORDER — HYDROXYZINE HCL 25 MG PO TABS: 25.0000 mg | ORAL_TABLET | Freq: Three times a day (TID) | ORAL | Status: DC | PRN

## 2015-05-05 MED ORDER — FENTANYL CITRATE (PF) 100 MCG/2ML IJ SOLN
100.0000 ug | Freq: Once | INTRAMUSCULAR | Status: AC
Start: 2015-05-05 — End: 2015-05-05
  Administered 2015-05-05: 100 ug via INTRAVENOUS

## 2015-05-05 MED ORDER — ARTIFICIAL TEARS OP OINT
TOPICAL_OINTMENT | OPHTHALMIC | Status: AC
  Filled 2015-05-05: qty 3.5

## 2015-05-05 MED ORDER — LOSARTAN POTASSIUM-HCTZ 100-25 MG PO TABS
1.0000 | ORAL_TABLET | Freq: Every day | ORAL | Status: DC
Start: 2015-05-05 — End: 2015-05-05

## 2015-05-05 MED ORDER — SODIUM CHLORIDE 0.45 % IV SOLN
INTRAVENOUS | Status: DC
  Administered 2015-05-05 – 2015-05-06 (×2): via INTRAVENOUS

## 2015-05-05 MED ORDER — VANCOMYCIN HCL IN DEXTROSE 1-5 GM/200ML-% IV SOLN
INTRAVENOUS | Status: AC
  Filled 2015-05-05: qty 200

## 2015-05-05 MED ORDER — LOSARTAN POTASSIUM 50 MG PO TABS
100.0000 mg | ORAL_TABLET | Freq: Every day | ORAL | Status: DC
  Administered 2015-05-05 – 2015-05-06 (×2): 100 mg via ORAL
  Filled 2015-05-05 (×2): qty 2

## 2015-05-05 MED ORDER — OXYCODONE HCL 5 MG PO TABS: 5.0000 mg | ORAL_TABLET | Freq: Once | ORAL | Status: DC | PRN

## 2015-05-05 MED ORDER — VANCOMYCIN HCL IN DEXTROSE 1-5 GM/200ML-% IV SOLN
1000.0000 mg | INTRAVENOUS | Status: AC
  Administered 2015-05-05: 1000 mg via INTRAVENOUS

## 2015-05-05 MED ORDER — AMLODIPINE BESYLATE 2.5 MG PO TABS
2.5000 mg | ORAL_TABLET | Freq: Every day | ORAL | Status: DC
  Administered 2015-05-05: 2.5 mg via ORAL
  Filled 2015-05-05: qty 1

## 2015-05-05 MED ORDER — MEPERIDINE HCL 25 MG/ML IJ SOLN: 6.2500 mg | INTRAMUSCULAR | Status: DC | PRN

## 2015-05-05 MED ORDER — HYDROMORPHONE HCL 1 MG/ML IJ SOLN: 0.5000 mg | INTRAMUSCULAR | Status: DC | PRN

## 2015-05-05 MED ORDER — NORTRIPTYLINE HCL 10 MG PO CAPS
10.0000 mg | ORAL_CAPSULE | Freq: Every day | ORAL | Status: DC
  Filled 2015-05-05 (×2): qty 1

## 2015-05-05 MED ORDER — HYDROCHLOROTHIAZIDE 25 MG PO TABS
25.0000 mg | ORAL_TABLET | Freq: Every day | ORAL | Status: DC
  Administered 2015-05-05 – 2015-05-06 (×2): 25 mg via ORAL
  Filled 2015-05-05 (×2): qty 1

## 2015-05-05 MED ORDER — UMECLIDINIUM BROMIDE 62.5 MCG/INH IN AEPB: 1.0000 | INHALATION_SPRAY | Freq: Every day | RESPIRATORY_TRACT | Status: DC

## 2015-05-05 MED ORDER — PHENYLEPHRINE 40 MCG/ML (10ML) SYRINGE FOR IV PUSH (FOR BLOOD PRESSURE SUPPORT)
PREFILLED_SYRINGE | INTRAVENOUS | Status: AC
  Filled 2015-05-05: qty 10

## 2015-05-05 MED ORDER — FENTANYL CITRATE (PF) 100 MCG/2ML IJ SOLN
INTRAMUSCULAR | Status: DC | PRN
  Administered 2015-05-05 (×2): 25 ug via INTRAVENOUS

## 2015-05-05 MED ORDER — HYDROMORPHONE HCL 1 MG/ML IJ SOLN: 0.2500 mg | INTRAMUSCULAR | Status: DC | PRN

## 2015-05-05 MED ORDER — PROPOFOL INFUSION 10 MG/ML OPTIME
INTRAVENOUS | Status: DC | PRN
  Administered 2015-05-05: 10 ug/kg/min via INTRAVENOUS

## 2015-05-05 MED ORDER — MIDAZOLAM HCL 2 MG/2ML IJ SOLN
INTRAMUSCULAR | Status: AC
  Filled 2015-05-05: qty 2

## 2015-05-05 MED ORDER — OXYCODONE HCL 5 MG/5ML PO SOLN: 5.0000 mg | Freq: Once | ORAL | Status: DC | PRN

## 2015-05-05 MED ORDER — VANCOMYCIN HCL 500 MG IV SOLR
500.0000 mg | Freq: Once | INTRAVENOUS | Status: AC
  Administered 2015-05-06: 500 mg via INTRAVENOUS
  Filled 2015-05-05 (×2): qty 500

## 2015-05-05 MED ORDER — METOPROLOL TARTRATE 100 MG PO TABS
100.0000 mg | ORAL_TABLET | Freq: Two times a day (BID) | ORAL | Status: DC
  Administered 2015-05-05: 100 mg via ORAL
  Filled 2015-05-05 (×2): qty 1

## 2015-05-05 MED ORDER — EPHEDRINE SULFATE 50 MG/ML IJ SOLN
INTRAMUSCULAR | Status: DC | PRN
  Administered 2015-05-05 (×2): 10 mg via INTRAVENOUS
  Administered 2015-05-05: 5 mg via INTRAVENOUS

## 2015-05-05 MED ORDER — PROPOFOL 10 MG/ML IV BOLUS
INTRAVENOUS | Status: AC
  Filled 2015-05-05: qty 20

## 2015-05-05 MED ORDER — BUPIVACAINE-EPINEPHRINE (PF) 0.5% -1:200000 IJ SOLN
INTRAMUSCULAR | Status: DC | PRN
  Administered 2015-05-05: 20 mL via PERINEURAL

## 2015-05-05 MED ORDER — FAMOTIDINE 20 MG PO TABS: 20.0000 mg | ORAL_TABLET | Freq: Two times a day (BID) | ORAL | Status: DC | PRN

## 2015-05-05 MED ORDER — LACTATED RINGERS IV SOLN
INTRAVENOUS | Status: DC | PRN
  Administered 2015-05-05 (×2): via INTRAVENOUS

## 2015-05-05 MED ORDER — ONDANSETRON HCL 4 MG/2ML IJ SOLN
INTRAMUSCULAR | Status: AC
  Filled 2015-05-05: qty 2

## 2015-05-05 MED ORDER — ALBUTEROL SULFATE (2.5 MG/3ML) 0.083% IN NEBU: 2.5000 mg | INHALATION_SOLUTION | Freq: Four times a day (QID) | RESPIRATORY_TRACT | Status: DC | PRN

## 2015-05-05 MED ORDER — PHENYLEPHRINE HCL 10 MG/ML IJ SOLN
INTRAMUSCULAR | Status: DC | PRN
  Administered 2015-05-05 (×3): 40 ug via INTRAVENOUS

## 2015-05-05 MED ORDER — FENTANYL CITRATE (PF) 100 MCG/2ML IJ SOLN
INTRAMUSCULAR | Status: AC
  Administered 2015-05-05: 100 ug via INTRAVENOUS
  Filled 2015-05-05: qty 2

## 2015-05-05 MED ORDER — BACITRACIN-NEOMYCIN-POLYMYXIN 400-5-5000 EX OINT
TOPICAL_OINTMENT | CUTANEOUS | Status: AC
  Filled 2015-05-05: qty 1

## 2015-05-05 MED ORDER — MAGNESIUM CITRATE PO SOLN: 1.0000 | Freq: Once | ORAL | Status: AC | PRN

## 2015-05-05 MED ORDER — METHOCARBAMOL 1000 MG/10ML IJ SOLN
500.0000 mg | Freq: Four times a day (QID) | INTRAVENOUS | Status: DC | PRN
  Filled 2015-05-05: qty 5

## 2015-05-05 MED ORDER — ACETAMINOPHEN 500 MG PO TABS: 500.0000 mg | ORAL_TABLET | Freq: Four times a day (QID) | ORAL | Status: DC | PRN

## 2015-05-05 MED ORDER — METHOCARBAMOL 500 MG PO TABS: 500.0000 mg | ORAL_TABLET | Freq: Four times a day (QID) | ORAL | Status: DC | PRN

## 2015-05-05 MED ORDER — OXYCODONE HCL 5 MG PO TABS
5.0000 mg | ORAL_TABLET | ORAL | Status: DC | PRN
  Administered 2015-05-06 (×2): 10 mg via ORAL
  Filled 2015-05-05 (×2): qty 2

## 2015-05-05 MED ORDER — LIDOCAINE HCL (CARDIAC) 20 MG/ML IV SOLN
INTRAVENOUS | Status: AC
  Filled 2015-05-05: qty 5

## 2015-05-05 MED ORDER — ASPIRIN EC 81 MG PO TBEC
81.0000 mg | DELAYED_RELEASE_TABLET | Freq: Every day | ORAL | Status: DC
  Administered 2015-05-05: 81 mg via ORAL
  Filled 2015-05-05: qty 1

## 2015-05-05 MED ORDER — SIMVASTATIN 20 MG PO TABS
20.0000 mg | ORAL_TABLET | Freq: Every day | ORAL | Status: DC
  Administered 2015-05-05: 20 mg via ORAL
  Filled 2015-05-05: qty 1

## 2015-05-05 MED ORDER — CHLORHEXIDINE GLUCONATE 4 % EX LIQD: 60.0000 mL | Freq: Once | CUTANEOUS | Status: DC

## 2015-05-05 MED ORDER — 0.9 % SODIUM CHLORIDE (POUR BTL) OPTIME
TOPICAL | Status: DC | PRN
  Administered 2015-05-05: 1000 mL

## 2015-05-05 MED ORDER — FENTANYL CITRATE (PF) 250 MCG/5ML IJ SOLN
INTRAMUSCULAR | Status: AC
  Filled 2015-05-05: qty 5

## 2015-05-05 MED ORDER — SENNA 8.6 MG PO TABS
1.0000 | ORAL_TABLET | Freq: Two times a day (BID) | ORAL | Status: DC
  Administered 2015-05-05 – 2015-05-06 (×3): 8.6 mg via ORAL
  Filled 2015-05-05 (×3): qty 1

## 2015-05-05 SURGICAL SUPPLY — 69 items
BANDAGE ELASTIC 3 VELCRO ST LF (GAUZE/BANDAGES/DRESSINGS) ×3 IMPLANT
BANDAGE ELASTIC 4 VELCRO ST LF (GAUZE/BANDAGES/DRESSINGS) ×3 IMPLANT
BANDAGE GAUZE 4  KLING STR (GAUZE/BANDAGES/DRESSINGS) ×6 IMPLANT
BIT DRILL 2.0 LNG QUCK RELEASE (BIT) ×1 IMPLANT
BIT DRILL 2.8 QUICK RELEASE (BIT) ×2 IMPLANT
BNDG COHESIVE 4X5 TAN STRL (GAUZE/BANDAGES/DRESSINGS) ×3 IMPLANT
BNDG ESMARK 4X9 LF (GAUZE/BANDAGES/DRESSINGS) ×3 IMPLANT
BNDG GAUZE ELAST 4 BULKY (GAUZE/BANDAGES/DRESSINGS) ×6 IMPLANT
CORDS BIPOLAR (ELECTRODE) ×3 IMPLANT
COVER MAYO STAND STRL (DRAPES) ×3 IMPLANT
COVER SURGICAL LIGHT HANDLE (MISCELLANEOUS) ×3 IMPLANT
CUFF TOURNIQUET SINGLE 18IN (TOURNIQUET CUFF) ×3 IMPLANT
CUFF TOURNIQUET SINGLE 24IN (TOURNIQUET CUFF) IMPLANT
DRAPE INCISE IOBAN 66X45 STRL (DRAPES) ×3 IMPLANT
DRAPE OEC MINIVIEW 54X84 (DRAPES) IMPLANT
DRILL 2.0 LNG QUICK RELEASE (BIT) ×3
DRILL 2.8 QUICK RELEASE (BIT) ×6
DRSG ADAPTIC 3X8 NADH LF (GAUZE/BANDAGES/DRESSINGS) ×3 IMPLANT
GAUZE SPONGE 4X4 12PLY STRL (GAUZE/BANDAGES/DRESSINGS) ×3 IMPLANT
GAUZE XEROFORM 1X8 LF (GAUZE/BANDAGES/DRESSINGS) ×3 IMPLANT
GAUZE XEROFORM 5X9 LF (GAUZE/BANDAGES/DRESSINGS) ×3 IMPLANT
GLOVE BIOGEL M STRL SZ7.5 (GLOVE) ×3 IMPLANT
GLOVE SS BIOGEL STRL SZ 8 (GLOVE) ×1 IMPLANT
GLOVE SUPERSENSE BIOGEL SZ 8 (GLOVE) ×2
GOWN STRL REUS W/ TWL LRG LVL3 (GOWN DISPOSABLE) ×2 IMPLANT
GOWN STRL REUS W/ TWL XL LVL3 (GOWN DISPOSABLE) ×3 IMPLANT
GOWN STRL REUS W/TWL LRG LVL3 (GOWN DISPOSABLE) ×4
GOWN STRL REUS W/TWL XL LVL3 (GOWN DISPOSABLE) ×6
KIT BASIN OR (CUSTOM PROCEDURE TRAY) ×3 IMPLANT
KIT ROOM TURNOVER OR (KITS) ×3 IMPLANT
LOOP VESSEL MAXI BLUE (MISCELLANEOUS) IMPLANT
MANIFOLD NEPTUNE II (INSTRUMENTS) ×3 IMPLANT
NEEDLE HYPO 25GX1X1/2 BEV (NEEDLE) IMPLANT
NS IRRIG 1000ML POUR BTL (IV SOLUTION) ×3 IMPLANT
PACK ORTHO EXTREMITY (CUSTOM PROCEDURE TRAY) ×3 IMPLANT
PAD ARMBOARD 7.5X6 YLW CONV (MISCELLANEOUS) ×6 IMPLANT
PAD CAST 3X4 CTTN HI CHSV (CAST SUPPLIES) ×1 IMPLANT
PAD CAST 4YDX4 CTTN HI CHSV (CAST SUPPLIES) ×1 IMPLANT
PADDING CAST COTTON 3X4 STRL (CAST SUPPLIES) ×2
PADDING CAST COTTON 4X4 STRL (CAST SUPPLIES) ×2
PLATE EXTENDED LEFT 5 HOLE (Plate) ×3 IMPLANT
PUTTY DBM STAGRAFT PLUS 5CC (Putty) ×3 IMPLANT
SCREW CORTICAL 3.5X20MM (Screw) ×3 IMPLANT
SCREW HEXALOBE LOCKING 3.5X16M (Screw) ×6 IMPLANT
SCREW HEXALOBE NON-LOCK 3.5X14 (Screw) ×3 IMPLANT
SCREW LOCKING 3.5X10MM (Screw) ×6 IMPLANT
SCREW NON LOCK 3.5X10MM (Screw) ×3 IMPLANT
SCREW NON LOCKING HEX 3.5X18MM (Screw) ×3 IMPLANT
SCREW NON LOCKING HEX 3.5X45 (Screw) ×3 IMPLANT
SCREW NONLOCK HEX 2.7X12 (Screw) ×3 IMPLANT
SOLUTION BETADINE 4OZ (MISCELLANEOUS) ×3 IMPLANT
SPECIMEN JAR SMALL (MISCELLANEOUS) ×3 IMPLANT
SPLINT FIBERGLASS 4X30 (CAST SUPPLIES) ×3 IMPLANT
SPONGE GAUZE 4X4 12PLY STER LF (GAUZE/BANDAGES/DRESSINGS) ×3 IMPLANT
SPONGE SCRUB IODOPHOR (GAUZE/BANDAGES/DRESSINGS) ×3 IMPLANT
SUT FIBERWIRE 2-0 18 17.9 3/8 (SUTURE) ×3
SUT PROLENE 3 0 PS 2 (SUTURE) IMPLANT
SUT PROLENE 4 0 PS 2 18 (SUTURE) ×6 IMPLANT
SUT VIC AB 2-0 CT1 27 (SUTURE) ×4
SUT VIC AB 2-0 CT1 TAPERPNT 27 (SUTURE) ×2 IMPLANT
SUT VIC AB 3-0 FS2 27 (SUTURE) ×6 IMPLANT
SUTURE FIBERWR 2-0 18 17.9 3/8 (SUTURE) ×1 IMPLANT
SYR CONTROL 10ML LL (SYRINGE) IMPLANT
TOWEL OR 17X24 6PK STRL BLUE (TOWEL DISPOSABLE) ×3 IMPLANT
TOWEL OR 17X26 10 PK STRL BLUE (TOWEL DISPOSABLE) ×6 IMPLANT
TUBE CONNECTING 12'X1/4 (SUCTIONS)
TUBE CONNECTING 12X1/4 (SUCTIONS) IMPLANT
UNDERPAD 30X30 INCONTINENT (UNDERPADS AND DIAPERS) ×3 IMPLANT
WATER STERILE IRR 1000ML POUR (IV SOLUTION) ×3 IMPLANT

## 2015-05-05 NOTE — Evaluation (Signed)
Occupational Therapy Evaluation Patient Details Name: Chelsey Campbell MRN: [id number] DOB: May 02, 1928 Today's Date: 05/05/2015    History of Present Illness 81 y.o. s/p ORIF left ulnar fracture.   Clinical Impression   Pt s/p above. Pt independent with ADLs, PTA. Feel pt will benefit from acute OT to address LUE and increase independence prior to d/c.     Follow Up Recommendations  No OT follow up;Supervision/Assistance - 24 hour    Equipment Recommendations  None recommended by OT    Recommendations for Other Services       Precautions / Restrictions Precautions Precautions: Fall Precaution Comments: no pushing, pulling, lifting with LUE (can use to hold light items) Restrictions Weight Bearing Restrictions: Yes LUE Weight Bearing: Non weight bearing      Mobility Bed Mobility Overal bed mobility: Needs Assistance Bed Mobility: Supine to Sit;Sit to Supine     Supine to sit: Mod assist Sit to supine: Mod assist   General bed mobility comments: cues for technique. Reverse trendelenburg positioned used to scoot HOB and assist given. Cues for NWB.  Transfers Overall transfer level: Needs assistance   Transfers: Stand Pivot Transfers;Sit to/from Stand Sit to Stand: Min assist Stand pivot transfers: Min assist       General transfer comment: assist to boost to stand and to support LUE         ADL Overall ADL's : Needs assistance/impaired             Lower Body Bathing: Moderate assistance;Sit to/from stand   Upper Body Dressing : Moderate assistance;Sitting       Toilet Transfer: Minimal assistance;Stand-pivot/ Sit to Stand;BSC   Toileting- Clothing Manipulation and Hygiene: Sit to/from stand;Minimal assistance (suspect Min assist for fasteners)       Functional mobility during ADLs: Minimal assistance (stand pivot (few steps)) General ADL Comments: Educated on edema management techniques and elevated LUE at end of session. Educated on UB  dressing technique.  balance not formally assessed in session.     Vision  Pt wears glasses.   Perception     Praxis      Pertinent Vitals/Pain Pain Assessment: Faces Faces Pain Scale: Hurts little more Pain Location: IV site on right foot Pain Intervention(s): Monitored during session     Hand Dominance Right   Extremity/Trunk Assessment Upper Extremity Assessment Upper Extremity Assessment: LUE deficits/detail LUE Deficits / Details: numb; edema LUE: Unable to fully assess due to immobilization LUE Sensation: decreased light touch   Lower Extremity Assessment Lower Extremity Assessment: Defer to PT evaluation       Communication Communication Communication: No difficulties   Cognition Arousal/Alertness: Awake/alert Behavior During Therapy: WFL for tasks assessed/performed Overall Cognitive Status: Impaired/Different from baseline Area of Impairment: Orientation;Problem solving Orientation Level: Disoriented to;Place   Memory:  (reports decreased short-term memory)       Problem Solving: Slow processing;Requires verbal cues     General Comments       Exercises  OT educated on retrograde massage and performed on Lt hand/digits. Performed PROM of digits and encouraged pt to be moving them as feeling returns.     Shoulder Instructions      Home Living Family/patient expects to be discharged to:: Private residence Living Arrangements: Children;Other (Comment) (son and daughter in law) Available Help at Discharge: Family;Available 24 hours/day Type of Home: House Home Access: Stairs to enter [employer] of Steps: 3 Entrance Stairs-Rails: Right;Left;Can reach both Home Layout: One level     Bathroom Shower/Tub: Tub/shower  unit;Walk-in shower   Bathroom Toilet:  (has both)     Home Equipment: Bedside commode;Other (comment);Cane - single point (think they have access to shower chair)          Prior Functioning/Environment Level of  Independence: Independent with assistive device(s)             OT Diagnosis: Acute pain   OT Problem List: Decreased range of motion;Decreased knowledge of use of DME or AE;Decreased knowledge of precautions;Impaired UE functional use;Increased edema;Impaired sensation;Decreased cognition   OT Treatment/Interventions: Self-care/ADL training;DME and/or AE instruction;Therapeutic activities;Patient/family education;Balance training;Cognitive remediation/compensation;Therapeutic exercise    OT Goals(Current goals can be found in the care plan section) Acute Rehab OT Goals Patient Stated Goal: not stated OT Goal Formulation: With patient Time For Goal Achievement: 05/12/15 Potential to Achieve Goals: Good ADL Goals Pt Will Transfer to Toilet: with supervision;ambulating Additional ADL Goal #1: Caregiver will be independent with assisting pt with ADLs while maintaining precautions of LUE. Additional ADL Goal #2: Pt will independently verbalize/utilize 3/3 edema management techniques.  OT Frequency: Min 2X/week   Barriers to D/C:            Co-evaluation              End of Session    Activity Tolerance: Patient tolerated treatment well Patient left: in bed;with call bell/phone within reach;with family/visitor present;with nursing/sitter in room   Time: 1700-1718 OT Time Calculation (min): 18 min Charges:  OT General Charges $OT Visit: 1 Procedure OT Evaluation $Initial OT Evaluation Tier I: 1 Procedure G-Codes: OT G-codes **NOT FOR INPATIENT CLASS** Functional Assessment Tool Used: clinical judgment Functional Limitation: Self care Self Care Current Status (D6438): At least 40 percent but less than 60 percent impaired, limited or restricted Self Care Goal Status (V8184): At least 1 percent but less than 20 percent impaired, limited or restricted  Benito Mccreedy OTR/L [phone] 05/05/2015, 5:37 PM

## 2015-05-05 NOTE — Anesthesia Preprocedure Evaluation (Signed)
Anesthesia Evaluation  Patient identified by MRN, date of birth, ID band Patient awake    Reviewed: Allergy & Precautions, NPO status , Patient's Chart, lab work & pertinent test results  Airway Mallampati: I  TM Distance: >3 FB Neck ROM: Full    Dental  (+) Upper Dentures, Dental Advisory Given   Pulmonary COPDformer smoker,  breath sounds clear to auscultation        Cardiovascular hypertension, Pt. on medications and Pt. on home beta blockers + CAD Valvular problems/murmurs: AVR several years ago. Doing well now. Rhythm:Regular Rate:Normal     Neuro/Psych    GI/Hepatic GERD-  Medicated and Controlled,  Endo/Other    Renal/GU Renal InsufficiencyRenal disease     Musculoskeletal   Abdominal   Peds  Hematology   Anesthesia Other Findings   Reproductive/Obstetrics                             Anesthesia Physical Anesthesia Plan  ASA: III  Anesthesia Plan: General and Regional   Post-op Pain Management:    Induction: Intravenous  Airway Management Planned: LMA  Additional Equipment:   Intra-op Plan:   Post-operative Plan: Extubation in OR  Informed Consent: I have reviewed the patients History and Physical, chart, labs and discussed the procedure including the risks, benefits and alternatives for the proposed anesthesia with the patient or authorized representative who has indicated his/her understanding and acceptance.   Dental advisory given  Plan Discussed with: CRNA, Anesthesiologist and Surgeon  Anesthesia Plan Comments:         Anesthesia Quick Evaluation

## 2015-05-05 NOTE — Progress Notes (Signed)
ANTIBIOTIC CONSULT NOTE - INITIAL  Pharmacy Consult for Vancomycin Indication: post-op ppx  Allergies  Allergen Reactions  . Procaine Hcl Anaphylaxis  . Morphine And Related Other (See Comments)    Patient gets disoriented/lost memory and doesn't tolerate it well.  Patient does not want morphine used.  . Ciprofloxacin Other (See Comments)    Infusion site reaction/ burning and redness  . Labetalol Hcl Other (See Comments)    Affects kidneys  . Penicillins Swelling, Rash and Other (See Comments)    blisters    Patient Measurements: Height: 4\' 11"  (149.9 cm) Weight: 145 lb (65.772 kg) IBW/kg (Calculated) : 43.2  Vital Signs: Temp: 97.4 F (36.3 C) (06/18 1608) Temp Source: Oral (06/18 1002) BP: 131/52 mmHg (06/18 1609) Pulse Rate: 60 (06/18 1609) Intake/Output from previous day:   Intake/Output from this shift: Total I/O In: 1000 [I.V.:1000] Out: 400 [Urine:400]  Labs:  Recent Labs  05/05/15 1057  HGB 13.3   CrCl cannot be calculated (Patient has no serum creatinine result on file.). No results for input(s): VANCOTROUGH, VANCOPEAK, VANCORANDOM, GENTTROUGH, GENTPEAK, GENTRANDOM, TOBRATROUGH, TOBRAPEAK, TOBRARND, AMIKACINPEAK, AMIKACINTROU, AMIKACIN in the last 72 hours.   Microbiology: No results found for this or any previous visit (from the past 720 hour(s)).  Medical History: Past Medical History  Diagnosis Date  . Hypertension   . Hyperlipidemia   . Insomnia   . Overweight(278.02)   . Vaginal prolapse     with pesary  . S/P cardiac pacemaker procedure November 2011    for tachy/brady syndrome  . Peripheral vascular disease     S/P  femoral bypass  . History of TIA (transient ischemic attack) 2011    TIA episode without an actual TIA   . Bursitis     in joints - hips/neck  . Aortic stenosis, severe   . Ventricular dysfunction     Left mild with EF 45-50%  . CHF (congestive heart failure)   . COPD (chronic obstructive pulmonary disease)   . SVD  (spontaneous vaginal delivery)     x 3  . Renal insufficiency     borderline - r/t bp - Dr Mercy Moore at [facility]  . Pacemaker     [employer]  . Shortness of breath     occasionally with exertion  . Heart murmur   . GERD (gastroesophageal reflux disease)   . Anemia     history  . CAD (coronary artery disease)   . Atrial fibrillation   . Borderline diabetes   . Arthritis     Medications:  Prescriptions prior to admission  Medication Sig Dispense Refill Last Dose  . acetaminophen (TYLENOL) 500 MG tablet Take 500 mg by mouth every 6 (six) hours as needed (pain).   05/03/2015  . albuterol (PROVENTIL HFA;VENTOLIN HFA) 108 (90 BASE) MCG/ACT inhaler Inhale 1-2 puffs into the lungs See admin instructions. Inhale 1 puff daily at bedtime, may also use once during the day as needed for shortness of breath   05/05/2015 at Unknown time  . amLODipine (NORVASC) 2.5 MG tablet Take 2.5 mg by mouth at bedtime.  2 05/04/2015 at Unknown time  . B Complex-C (SUPER B COMPLEX/VITAMIN C) TABS Take 1 tablet by mouth daily.    05/04/2015 at Unknown time  . HYDROcodone-acetaminophen (NORCO/VICODIN) 5-325 MG per tablet Take 1-2 tablets by mouth every 4 (four) hours as needed (pain).   05/05/2015 at Unknown time  . losartan-hydrochlorothiazide (HYZAAR) 100-25 MG per tablet Take 1 tablet by mouth daily.  05/04/2015 at Unknown time  . metoprolol (LOPRESSOR) 100 MG tablet Take 100 mg by mouth 2 (two) times daily.   05/05/2015 at 0830  . Multiple Vitamin (MULTIVITAMIN WITH MINERALS) TABS tablet Take 1 tablet by mouth daily. Centrum Silver   05/04/2015 at Unknown time  . nortriptyline (PAMELOR) 10 MG capsule Take 10 mg by mouth at bedtime.   05/04/2015 at Unknown time  . omeprazole (PRILOSEC OTC) 20 MG tablet Take 20 mg by mouth daily.   05/04/2015 at Unknown time  . senna-docusate (SENNA PLUS) 8.6-50 MG per tablet Take 1 tablet by mouth daily as needed for mild constipation.   Past Month at Unknown time  .  simvastatin (ZOCOR) 20 MG tablet Take 20 mg by mouth at bedtime.   05/04/2015 at Unknown time  . Umeclidinium Bromide (INCRUSE ELLIPTA) 62.5 MCG/INH AEPB Inhale 1 puff into the lungs daily.   05/05/2015 at Unknown time  . aspirin EC 81 MG tablet Take 81 mg by mouth at bedtime.   05/03/2015  . oxyCODONE-acetaminophen (PERCOCET/ROXICET) 5-325 MG per tablet Take 1-2 tablets by mouth every 6 (six) hours as needed. (Patient not taking: Reported on 05/04/2015) 30 tablet 0 Not Taking at Unknown time   Assessment: 81 yo F s/p L elbow surgery.  Pharmacy asked to dose Vancomycin for post-op abx ppx.  Pt received Vancomycin 1gm pre-op at 1300 today.   No SCr from this admission.  Will use SCr 1.6 from 2014 admission to calculate CrCl.  CrCl ~ 30 ml/min.  Goal of Therapy:  Appropriate abx propylaxis  Plan:  Vancomycin 500 mg IV x 1 dose tomorrow. Rx will sign off.  [employer], Pharm.D., BCPS Clinical Pharmacist Pager [phone] 05/05/2015 4:45 PM

## 2015-05-05 NOTE — Transfer of Care (Signed)
Immediate Anesthesia Transfer of Care Note  Patient: Chelsey Campbell  Procedure(s) Performed: Procedure(s): OPEN REDUCTION INTERNAL FIXATION (ORIF) ULNAR FRACTURE (Left)  Patient Location: PACU  Anesthesia Type:MAC  Level of Consciousness: awake, alert , oriented and patient cooperative  Airway & Oxygen Therapy: Patient Spontanous Breathing and Patient connected to nasal cannula oxygen  Post-op Assessment: Report given to RN and Post -op Vital signs reviewed and stable  Post vital signs: Reviewed and stable  Last Vitals:  BP 131/44 HR 61 RR 15 SpO2 63% on 2L [place]  Complications: No apparent anesthesia complications

## 2015-05-05 NOTE — Progress Notes (Signed)
Patient reports increased shob this am. Dr. Al Corpus made aware CXR ordered

## 2015-05-05 NOTE — Anesthesia Postprocedure Evaluation (Signed)
  Anesthesia Post-op Note  Patient: Chelsey Campbell  Procedure(s) Performed: Procedure(s): OPEN REDUCTION INTERNAL FIXATION (ORIF) ULNAR FRACTURE (Left)  Patient Location: PACU  Anesthesia Type: Regional, MAC   Level of Consciousness: awake, alert  and oriented  Airway and Oxygen Therapy: Patient Spontanous Breathing  Post-op Pain: none  Post-op Assessment: Post-op Vital signs reviewed  Post-op Vital Signs: Reviewed  Last Vitals:  Filed Vitals:   05/05/15 1531  BP: 132/53  Pulse: 58  Temp:   Resp: 12    Complications: No apparent anesthesia complications

## 2015-05-05 NOTE — Op Note (Signed)
See dict #[id number] Amedeo Plenty MD

## 2015-05-05 NOTE — Op Note (Signed)
Chelsey Campbell, Chelsey Campbell               ACCOUNT NO.:  [id number]  MEDICAL RECORD NO.:  [id number]  LOCATION:  5N07C                        FACILITY:  [facility]  PHYSICIAN:  Satira Anis. Ivalene Platte, M.D.DATE OF BIRTH:  06/03/1928  DATE OF PROCEDURE:  05/05/2015 DATE OF DISCHARGE:                              OPERATIVE REPORT   PREOPERATIVE DIAGNOSIS:  Left comminuted complex elbow fracture involving the olecranon and radial head.  POSTOPERATIVE DIAGNOSIS:  Left comminuted complex elbow fracture involving the olecranon and radial head.  PROCEDURE: 1. Left ulna/olecranon highly comminuted intra-articular fracture ORIF     with allograft bone graft and Acumed plate and screw construct. 2. Close treatment and manipulation radial head fracture, left elbow. 3. AP, lateral, and oblique x-rays performed, examined, and     interpreted by myself; left elbow.  SURGEON:  Satira Anis. Amedeo Plenty, MD  ASSISTANT:  Avelina Laine, PA-C  COMPLICATIONS:  None.  ANESTHESIA:  Block with IV sedation.  DRAINS:  None.  ESTIMATED BLOOD LOSS:  Minimal.  TOURNIQUET TIME:  Less than 90 minutes.  INDICATIONS FOR PROCEDURE:  This patient is an 81 year old female, who presents with the above-mentioned diagnosis.  I have counseled her in regard to the risks and benefits of surgery including risk of infection, bleeding, anesthesia, damage to normal structures, and failure of surgery to accomplish its intended goals, limbs and joint function. With this in mind, the patient desires to proceed.  All questions have been encouraged and answered preoperatively.  OPERATIVE PROCEDURE:  The patient was seen by myself and Anesthesia, taken to operative suite, underwent smooth induction of IV sedation and her preoperative block was noted to be in excellent working fashion.  At this juncture, she was placed in a sloppy lateral position.  Prepped and draped with preoperative Hibiclens scrub followed by a formal Betadine scrub and  paint.  Outline marks were made visually and with marking pen. __________ was placed.  Skin was in satisfactory condition.  An incision was made under 250 mmHg tourniquet control.  The patient tolerated this well.  There were no complicating features.  Following this, skin flaps were elevated nicely just off the fascial tissue.  The comminuted complex fracture fragments were identified.  The patient had a pushed back area of chondral surface which we very carefully and cautiously reduced back.  We used a combination of curette, periosteal elevator, Freer elevator to perform maneuvering of the bony fragments and debridement of the clotted blood.  The joint was cleaned out nicely. Following this, reconstruction of the articular anatomy ensued with allograft bone grafting and placed followed by application of an Acumed plate.  The _____ Acumed olecranon plate was placed.  There were no complicating features.  With this, I was able to achieve excellent fixation.  The patient had coaptation of the bone without difficulty and a compression mode of screw fixation was applied initially.  I was pleased with the construct. She was quite stable on AP lateral and oblique views.  At this juncture, we performed very gentle closed manipulation of the radial head region.  This looked quite good.  Despite a radial head fracture, she lined up well.  Had no crepitus block to motion  or other issues and thus given the closed treatment possibilities we felt that over operating would be dangerous in this patient as she had no instability and nice smooth arc of motion.  At this time, we deflated the tourniquet, irrigated copiously with greater than 2 L fluid and I closed the fascia with 2-0 Vicryl followed by closure of the subcu with 3-0 Vicryl and closure of the skin edge with 4-0 Prolene.  The patient has small abrasion about the medial aspect of the arm.  This will be monitored closely.  This was treated  with Neosporin Adaptic and Xeroform.  Incision was as well.  The lass than a quarter-sized abrasion was away from the incision as preplanned and kept out of way and had no cross contamination in the entire case.  She was placed in a large bulky dressing with a posterior splint.  There were no complicating features.  She will be monitored in the postprocedural area and admitted overnight for IV antibiotics, general postop observation, and other measures. Do's and don'ts have been discussed.  All questions have been encouraged and answered.  Going forward, we will plan for complete immobilization for the first 2 weeks and suture removal, Steri-Strip application, and following this pinning wound conditions posterior splint and well arm assisted motion. She has a be padded at all times and she cannot have her skin in direct contact with her splint.  We need to watch this closely.  These notes have been discussed.  Antibiotics will be given and the a skin condition watched closely  given her age and skin fragility.     Satira Anis. Amedeo Plenty, M.D.     [facility]  D:  05/05/2015  T:  05/05/2015  Job:  [id number]

## 2015-05-05 NOTE — Anesthesia Procedure Notes (Addendum)
Anesthesia Regional Block:  Supraclavicular block  Pre-Anesthetic Checklist: ,, timeout performed, Correct Patient, Correct Site, Correct Laterality, Correct Procedure, Correct Position, site marked, Risks and benefits discussed,  Surgical consent,  Pre-op evaluation,  At surgeon's request and post-op pain management  Laterality: Left and Upper  Prep: chloraprep       Needles:  Injection technique: Single-shot  Needle Type: Echogenic Stimulator Needle     Needle Length: 5cm 5 cm Needle Gauge: 21 and 21 G    Additional Needles:  Procedures: ultrasound guided (picture in chart) Supraclavicular block Narrative:  Start time: 05/05/2015 11:25 AM End time: 05/05/2015 11:30 AM Injection made incrementally with aspirations every 5 mL.  Performed by: Personally  Anesthesiologist: [place], DAVID   Procedure Name: MAC Date/Time: 05/05/2015 1:15 PM Performed by: Willeen Cass P Pre-anesthesia Checklist: Patient identified, Timeout performed, Emergency Drugs available, Suction available and Patient being monitored Patient Re-evaluated:Patient Re-evaluated prior to inductionOxygen Delivery Method: Simple face mask Intubation Type: IV induction

## 2015-05-05 NOTE — H&P (Signed)
Chelsey Campbell is an 81 y.o. female.   Chief Complaint: Left comminuted elbow fracture involving the olecranon and radial head HPI: Patient presents for evaluation and surgical treatment of her left elbow fracture. She denies neck back chest or abdominal pain. She is alert and oriented.  Patient presents for evaluation and treatment of the of their upper extremity predicament. The patient denies neck, back, chest or  abdominal pain. The patient notes that they have no lower extremity problems. The patients primary complaint is noted. We are planning surgical care pathway for the upper extremity.  Past Medical History  Diagnosis Date  . Hypertension   . Hyperlipidemia   . Insomnia   . Overweight(278.02)   . Vaginal prolapse     with pesary  . S/P cardiac pacemaker procedure November 2011    for tachy/brady syndrome  . Peripheral vascular disease     S/P  femoral bypass  . History of TIA (transient ischemic attack) 2011    TIA episode without an actual TIA   . Bursitis     in joints - hips/neck  . Aortic stenosis, severe   . Ventricular dysfunction     Left mild with EF 45-50%  . CHF (congestive heart failure)   . COPD (chronic obstructive pulmonary disease)   . SVD (spontaneous vaginal delivery)     x 3  . Renal insufficiency     borderline - r/t bp - Dr Mercy Moore at [facility]  . Pacemaker     [employer]  . Shortness of breath     occasionally with exertion  . Heart murmur   . GERD (gastroesophageal reflux disease)   . Anemia     history  . CAD (coronary artery disease)   . Atrial fibrillation   . Borderline diabetes   . Arthritis     Past Surgical History  Procedure Laterality Date  . Eye surgery       Bilateral Cataracts  . Coronary artery bypass graft  2013    replaced aortic valve  . Pad  2009    vein right arm to left leg   . Wrist surgery  2005    fell broke both wrists and had reconstrictive surgery on both wrists  . Cardiac valve replacement   2013    aorta replacement w/ stent  . Insert / replace / remove pacemaker  2011    [employer]  . Laparoscopic assisted vaginal hysterectomy N/A 01/12/2013    Procedure: LAPAROSCOPIC ASSISTED VAGINAL HYSTERECTOMY;  Surgeon: Maeola Sarah. Landry Mellow, MD;  Location: [facility];  Service: Gynecology;  Laterality: N/A;  . Salpingoophorectomy Bilateral 01/12/2013    Procedure: SALPINGO OOPHORECTOMY;  Surgeon: Maeola Sarah. Landry Mellow, MD;  Location: [facility];  Service: Gynecology;  Laterality: Bilateral;  . Cystocele repair N/A 01/12/2013    Procedure: ANTERIOR REPAIR (CYSTOCELE);  Surgeon: Maeola Sarah. Landry Mellow, MD;  Location: [facility];  Service: Gynecology;  Laterality: N/A;  . Cystoscopy N/A 01/12/2013    Procedure: CYSTOSCOPY;  Surgeon: Maeola Sarah. Landry Mellow, MD;  Location: [facility];  Service: Gynecology;  Laterality: N/A;  . Abdominal hysterectomy  01-12-13    Family History  Problem Relation Age of Onset  . Heart disease Father   . Heart attack Father 23  . Diabetes Father   . Cancer Sister     brain  . Heart disease Brother   . Diabetes Daughter   . Hyperlipidemia Son    Social History:  reports that she quit smoking about 4  years ago. Her smoking use included Cigarettes. She has a 60 pack-year smoking history. She has never used smokeless tobacco. She reports that she does not drink alcohol or use illicit drugs.  Allergies:  Allergies  Allergen Reactions  . Procaine Hcl Anaphylaxis  . Morphine And Related Other (See Comments)    Patient gets disoriented/lost memory and doesn't tolerate it well.  Patient does not want morphine used.  . Ciprofloxacin Other (See Comments)    Infusion site reaction/ burning and redness  . Labetalol Hcl Other (See Comments)    Affects kidneys  . Penicillins Swelling, Rash and Other (See Comments)    blisters    Medications Prior to Admission  Medication Sig Dispense Refill  . acetaminophen (TYLENOL) 500 MG tablet Take 500 mg by mouth every 6 (six) hours as needed (pain).    Marland Kitchen albuterol  (PROVENTIL HFA;VENTOLIN HFA) 108 (90 BASE) MCG/ACT inhaler Inhale 1-2 puffs into the lungs See admin instructions. Inhale 1 puff daily at bedtime, may also use once during the day as needed for shortness of breath    . amLODipine (NORVASC) 2.5 MG tablet Take 2.5 mg by mouth at bedtime.  2  . B Complex-C (SUPER B COMPLEX/VITAMIN C) TABS Take 1 tablet by mouth daily.     Marland Kitchen HYDROcodone-acetaminophen (NORCO/VICODIN) 5-325 MG per tablet Take 1-2 tablets by mouth every 4 (four) hours as needed (pain).    Marland Kitchen losartan-hydrochlorothiazide (HYZAAR) 100-25 MG per tablet Take 1 tablet by mouth daily.     . metoprolol (LOPRESSOR) 100 MG tablet Take 100 mg by mouth 2 (two) times daily.    . Multiple Vitamin (MULTIVITAMIN WITH MINERALS) TABS tablet Take 1 tablet by mouth daily. Centrum Silver    . nortriptyline (PAMELOR) 10 MG capsule Take 10 mg by mouth at bedtime.    Marland Kitchen omeprazole (PRILOSEC OTC) 20 MG tablet Take 20 mg by mouth daily.    Marland Kitchen senna-docusate (SENNA PLUS) 8.6-50 MG per tablet Take 1 tablet by mouth daily as needed for mild constipation.    . simvastatin (ZOCOR) 20 MG tablet Take 20 mg by mouth at bedtime.    Marland Kitchen Umeclidinium Bromide (INCRUSE ELLIPTA) 62.5 MCG/INH AEPB Inhale 1 puff into the lungs daily.    Marland Kitchen aspirin EC 81 MG tablet Take 81 mg by mouth at bedtime.    Marland Kitchen oxyCODONE-acetaminophen (PERCOCET/ROXICET) 5-325 MG per tablet Take 1-2 tablets by mouth every 6 (six) hours as needed. (Patient not taking: Reported on 05/04/2015) 30 tablet 0    Results for orders placed or performed during the hospital encounter of 05/05/15 (from the past 48 hour(s))  Glucose, capillary     Status: Abnormal   Collection Time: 05/05/15 10:05 AM  Result Value Ref Range   Glucose-Capillary 136 (H) 65 - 99 mg/dL  I-STAT 4, (NA,K, GLUC, HGB,HCT)     Status: Abnormal   Collection Time: 05/05/15 10:57 AM  Result Value Ref Range   Sodium 137 135 - 145 mmol/L   Potassium 4.2 3.5 - 5.1 mmol/L   Glucose, Bld 141 (H) 65 - 99  mg/dL   HCT 39.0 36.0 - 46.0 %   Hemoglobin 13.3 12.0 - 15.0 g/dL   Dg Chest 2 View  05/05/2015   CLINICAL DATA:  Shortness of breath.  EXAM: CHEST  2 VIEW  COMPARISON:  January 12, 2013.  FINDINGS: Stable cardiomediastinal silhouette. Status post cardiac valve repair. Left sided pacemaker is unchanged in position. No pneumothorax or pleural effusion is noted. No acute pulmonary  disease is noted.  IMPRESSION: No active cardiopulmonary disease.   Electronically Signed   By: Marijo Conception, M.D.   On: 05/05/2015 10:41    ROS  Blood pressure 150/66, pulse 59, temperature 97.2 F (36.2 C), temperature source Oral, resp. rate 12, height 4\' 11"  (1.499 m), weight 65.772 kg (145 lb), SpO2 96 %. Physical Exam left elbow swelling pain and obvious ulna fracture.  She is intact to sensation and vascular exam and has no signs of compartment syndrome.  I reviewed her findings at length.   The patient is alert and oriented in no acute distress. The patient complains of pain in the affected upper extremity.  The patient is noted to have a normal HEENT exam. Lung fields show equal chest expansion and no shortness of breath. Abdomen exam is nontender without distention. Lower extremity examination does not show any fracture dislocation or blood clot symptoms. Pelvis is stable and the neck and back are stable and nontender.  Assessment/Plan Plan for open reduction internal fixation left elbow fracture with repair reconstruction is necessary including possible radial head arthroplasty versus fixation as well as ORIF of the patient's comminuted ulna fracture   We are planning surgery for your upper extremity. The risk and benefits of surgery to include risk of bleeding, infection, anesthesia,  damage to normal structures and failure of the surgery to accomplish its intended goals of relieving symptoms and restoring function have been discussed in detail. With this in mind we plan to proceed. I have  specifically discussed with the patient the pre-and postoperative regime and the dos and don'ts and risk and benefits in great detail. Risk and benefits of surgery also include risk of dystrophy(CRPS), chronic nerve pain, failure of the healing process to go onto completion and other inherent risks of surgery The relavent the pathophysiology of the disease/injury process, as well as the alternatives for treatment and postoperative course of action has been discussed in great detail with the patient who desires to proceed.  We will do everything in our power to help you (the patient) restore function to the upper extremity. It is a pleasure to see this patient today.  Paulene Floor 05/05/2015, 12:09 PM

## 2015-05-06 DIAGNOSIS — S52022A Displaced fracture of olecranon process without intraarticular extension of left ulna, initial encounter for closed fracture: Secondary | ICD-10-CM | POA: Diagnosis not present

## 2015-05-06 LAB — URINALYSIS, ROUTINE W REFLEX MICROSCOPIC
BILIRUBIN URINE: NEGATIVE
GLUCOSE, UA: NEGATIVE mg/dL
HGB URINE DIPSTICK: NEGATIVE
KETONES UR: 15 mg/dL — AB
NITRITE: NEGATIVE
PROTEIN: NEGATIVE mg/dL
Specific Gravity, Urine: 1.016 (ref 1.005–1.030)
UROBILINOGEN UA: 0.2 mg/dL (ref 0.0–1.0)
pH: 5 (ref 5.0–8.0)

## 2015-05-06 LAB — URINE MICROSCOPIC-ADD ON

## 2015-05-06 NOTE — Discharge Instructions (Signed)
.  Keep bandage clean and dry.  Call for any problems.  No smoking.  Criteria for driving a car: you should be off your pain medicine for 7-8 hours, able to drive one handed(confident), thinking clearly and feeling able in your judgement to drive. Continue elevation as it will decrease swelling.  If instructed by MD move your fingers within the confines of the bandage/splint.  Use ice if instructed by your MD. Call immediately for any sudden loss of feeling in your hand/arm or change in functional abilities of the extremity. .We recommend that you to take vitamin C 1000 mg a day to promote healing. We also recommend that if you require  pain medicine that you take a stool softener to prevent constipation as most pain medicines will have constipation side effects. We recommend either Peri-Colace or Senokot and recommend that you also consider adding MiraLAX to prevent the constipation affects from pain medicine if you are required to use them. These medicines are over the counter and maybe purchased at a local pharmacy. A cup of yogurt and a probiotic can also be helpful during the recovery process as the medicines can disrupt your intestinal environment.

## 2015-05-06 NOTE — Progress Notes (Signed)
Orthopedic Tech Progress Note Patient Details:  Chelsey Campbell [birth year] [id number]  Ortho Devices Type of Ortho Device: Arm sling Ortho Device/Splint Location: LUE Ortho Device/Splint Interventions: Application   [name] 05/06/2015, 1:48 PM

## 2015-05-06 NOTE — Evaluation (Signed)
Physical Therapy Evaluation Patient Details Name: CELE MOTE MRN: [id number] DOB: 15-Jan-1928 Today's Date: 05/06/2015   History of Present Illness  Pt is a 81 y.o.F s/p ORIF left ulnar fracture.Pt's PMH includes HTN, PVD, TIA, CHF, COPD, pacemaker, anemia, arthritis, and wrist surgery.  Clinical Impression  Patient is s/p above surgery resulting in functional limitations due to the deficits listed below (see PT Problem List). Pt demonstrated ability to ambulate 150 ft w/ SPC and ascend/descend 2 steps this session. Pt w/ some instability during ambulation and will benefit from skilled PT to increase their independence and safety with mobility to allow discharge to the venue listed below.      Follow Up Recommendations No PT follow up;Supervision for mobility/OOB    Equipment Recommendations  None recommended by PT    Recommendations for Other Services       Precautions / Restrictions Precautions Precautions: Fall Precaution Comments: no pushing, pulling, lifting with LUE (can use to hold light items) Restrictions Weight Bearing Restrictions: Yes LUE Weight Bearing: Non weight bearing      Mobility  Bed Mobility Overal bed mobility: Needs Assistance Bed Mobility: Supine to Sit     Supine to sit: Min assist     General bed mobility comments: Min assist w/ use of bed pad to position hips to sitting EOB.  Cues to adhere to NWB status.  Increased time and use of bed rail.  Transfers Overall transfer level: Needs assistance Equipment used: Straight cane Transfers: Sit to/from Stand Sit to Stand: Min guard         General transfer comment: Min guard to ensure pt maintains balance.    Ambulation/Gait Ambulation/Gait assistance: Min guard Ambulation Distance (Feet): 150 Feet Assistive device: Straight cane Gait Pattern/deviations: Step-through pattern;Shuffle;Antalgic;Decreased stride length;Trunk flexed;Narrow base of support Gait velocity: decreased Gait  velocity interpretation: Below normal speed for age/gender General Gait Details: Dec gait speed, drift to L toward railing, cues to avoid bumping LUE on wall or door frame  Stairs Stairs: Yes Stairs assistance: Min guard Stair Management: One rail Right;Forwards;Step to pattern Number of Stairs: 2 General stair comments: Cues to not hold cane in LUE but to hold onto railing on R.  Min guard to ensure pt maintains balance.  Wheelchair Mobility    Modified Rankin (Stroke Patients Only)       Balance Overall balance assessment: Needs assistance;History of Falls Sitting-balance support: Single extremity supported;Feet supported Sitting balance-Leahy Scale: Fair     Standing balance support: Single extremity supported;During functional activity Standing balance-Leahy Scale: Fair Standing balance comment: Pt able to stand EOB w/o UE support                             Pertinent Vitals/Pain Pain Assessment: Faces Faces Pain Scale: No hurt Pain Intervention(s): Limited activity within patient's tolerance;Monitored during session;Repositioned    Home Living Family/patient expects to be discharged to:: Private residence Living Arrangements: Children (daughter and son in law) Available Help at Discharge: Family;Available 24 hours/day Type of Home: House Home Access: Stairs to enter Entrance Stairs-Rails: Right;Left;Can reach both Entrance Stairs-Number of Steps: 3 Home Layout: One level Home Equipment: Bedside commode;Cane - single point;Walker - 2 wheels      Prior Function Level of Independence: Independent with assistive device(s)         Comments: uses cane at all times     Hand Dominance   Dominant Hand: Right    Extremity/Trunk Assessment  Upper Extremity Assessment: Defer to OT evaluation           Lower Extremity Assessment: Generalized weakness         Communication   Communication: No difficulties  Cognition Arousal/Alertness:  Awake/alert Behavior During Therapy: WFL for tasks assessed/performed Overall Cognitive Status: History of cognitive impairments - at baseline Area of Impairment: Orientation;Problem solving Orientation Level: Disoriented to;Place   Memory: Decreased short-term memory       Problem Solving: Slow processing;Requires verbal cues      General Comments      Exercises        Assessment/Plan    PT Assessment Patient needs continued PT services  PT Diagnosis Difficulty walking;Abnormality of gait;Generalized weakness;Acute pain   PT Problem List Decreased strength;Decreased range of motion;Decreased activity tolerance;Decreased balance;Decreased mobility;Decreased coordination;Decreased cognition;Decreased knowledge of use of DME;Decreased safety awareness;Decreased knowledge of precautions;Impaired sensation;Decreased skin integrity;Pain  PT Treatment Interventions DME instruction;Gait training;Stair training;Functional mobility training;Therapeutic activities;Therapeutic exercise;Balance training;Neuromuscular re-education;Cognitive remediation;Patient/family education;Modalities   PT Goals (Current goals can be found in the Care Plan section) Acute Rehab PT Goals Patient Stated Goal: to go home PT Goal Formulation: With patient/family Time For Goal Achievement: 05/13/15 Potential to Achieve Goals: Good    Frequency Min 3X/week   Barriers to discharge Inaccessible home environment 3 steps to enter home    Co-evaluation               End of Session Equipment Utilized During Treatment: Gait belt Activity Tolerance: No increased pain;Patient limited by fatigue Patient left: in chair;with call bell/phone within reach;with chair alarm set;with family/visitor present Nurse Communication: Mobility status;Precautions;Weight bearing status    Functional Assessment Tool Used: Clinical Judgement Functional Limitation: Mobility: Walking and moving around Mobility: Walking and  Moving Around Current Status (G3875): At least 20 percent but less than 40 percent impaired, limited or restricted Mobility: Walking and Moving Around Goal Status [phone]): At least 1 percent but less than 20 percent impaired, limited or restricted    Time: 1142-1215 PT Time Calculation (min) (ACUTE ONLY): 33 min   Charges:   PT Evaluation $Initial PT Evaluation Tier I: 1 Procedure PT Treatments $Gait Training: 8-22 mins   PT G Codes:   PT G-Codes **NOT FOR INPATIENT CLASS** Functional Assessment Tool Used: Clinical Judgement Functional Limitation: Mobility: Walking and moving around Mobility: Walking and Moving Around Current Status (R5188): At least 20 percent but less than 40 percent impaired, limited or restricted Mobility: Walking and Moving Around Goal Status [phone]): At least 1 percent but less than 20 percent impaired, limited or restricted   Joslyn Hy PT, DPT [phone] Pager: [phone] 05/06/2015, 12:53 PM

## 2015-05-06 NOTE — Discharge Summary (Signed)
Physician Discharge Summary  Patient ID: Chelsey Campbell MRN: [id number] DOB/AGE: Aug 12, 1928 81 y.o.  Admit date: 05/05/2015 Discharge date: 05/06/2015  Admission Diagnoses: Patient Active Problem List   Diagnosis Date Noted  . Olecranon fracture 05/05/2015  . Occlusion and stenosis of carotid artery without mention of cerebral infarction 06/20/2013  . Peripheral vascular disease, unspecified 06/20/2013  . AKI (acute kidney injury) 01/13/2013  . Chronic kidney disease 01/13/2013  . Atherosclerosis of native arteries of the extremities with intermittent claudication 05/10/2012  . HYPERLIPIDEMIA 10/21/2010  . HYPERTENSION 10/21/2010  . HIP PAIN, RIGHT 10/21/2010  . TROCHANTERIC BURSITIS, RIGHT 10/21/2010    Discharge Diagnoses: same, improved Active Problems:   Olecranon fracture   Discharged Condition: Stable   Hospital Course: The patient is a delightful 81 year old female who unfortunately sustained a comminuted left olecranon fracture as well as radial head fracture. She was seen and evaluated in our office setting. She is very active and given the degree of displacement discussed with the patient and her family recommendations for surgical intervention for fixation of the fractured bones. We discussed with her ORIF of the olecranon/ulna possible radial head ORIF. The patient underwent standard preoperative workup she was noted to be stable for surgical endeavors. She was admitted on June 18 and taken to the operative suite where she underwent open reduction internal fixation of the left olecranon fracture with plate fixation and allograft bone grafting. Once this was performed it was noted that her radial head fracture was stable and did not need to be addressed operatively. The patient tolerated the procedure well please see operative report for full details. She was admitted for standard observation, IV antibiotics, pain management. On postoperative day #1 she was doing quite well  she was tolerating a regular diet, voiding without difficulties and her pain was controlled. Discussed all issues with her and she was quite eager for discharge home to the care of her daughter and son-in-law. We have discussed with her ambulatory issues as she typically uses a cane or walker. We will recommend that she use a sling when she is a ambulatory try to use her cane with her right hand but if she needs a platform walker could be provided, however we do not want any significant weightbearing to the left upper extremity given the fracture process. We discussed all issues with the family and the patient. She was given her pain medications in the office setting as well as Bactrim given the abrasion noted on her elbow. She will resume these at the time of her return home.  Consults: None  Treatments: 1. Left ulna/olecranon highly comminuted intra-articular fracture ORIF  with allograft bone graft and Acumed plate and screw construct. 2. Close treatment and manipulation radial head fracture, left elbow. 3. AP, lateral, and oblique x-rays performed, examined, and  interpreted by myself; left elbow.   Discharge Exam: Blood pressure 132/57, pulse 58, temperature 98.5 F (36.9 C), temperature source Oral, resp. rate 17, height 4\' 11"  (1.499 m), weight 65.772 kg (145 lb), SpO2 97 %. Evaluation of the left upper extremity shows that her splint is clean dry and intact. She has mild edema of the digits significant ecchymosis but has excellent digital range of motion and is neurovascularly intact  The patient is alert and oriented in no acute distress. The patient complains of pain in the affected upper extremity.  The patient is noted to have a normal HEENT exam. Lung fields show equal chest expansion and no shortness of breath. Abdomen  exam is nontender without distention. Lower extremity examination does not show any fracture dislocation or blood clot symptoms. Pelvis is stable and the neck  and back are stable and nontender.  Disposition: 01-Home or Self Care  Discharge Instructions    Call MD / Call 911    Complete by:  As directed   If you experience chest pain or shortness of breath, CALL 911 and be transported to the hospital emergency room.  If you develope a fever above 101 F, pus (white drainage) or increased drainage or redness at the wound, or calf pain, call your surgeon's office.     Constipation Prevention    Complete by:  As directed   Drink plenty of fluids.  Prune juice may be helpful.  You may use a stool softener, such as Colace (over the counter) 100 mg twice a day.  Use MiraLax (over the counter) for constipation as needed.     Diet - low sodium heart healthy    Complete by:  As directed      Increase activity slowly as tolerated    Complete by:  As directed      Sling    Complete by:  As directed             Medication List    STOP taking these medications        oxyCODONE-acetaminophen 5-325 MG per tablet  Commonly known as:  PERCOCET/ROXICET      TAKE these medications        acetaminophen 500 MG tablet  Commonly known as:  TYLENOL  Take 500 mg by mouth every 6 (six) hours as needed (pain).     albuterol 108 (90 BASE) MCG/ACT inhaler  Commonly known as:  PROVENTIL HFA;VENTOLIN HFA  Inhale 1-2 puffs into the lungs See admin instructions. Inhale 1 puff daily at bedtime, may also use once during the day as needed for shortness of breath     amLODipine 2.5 MG tablet  Commonly known as:  NORVASC  Take 2.5 mg by mouth at bedtime.     aspirin EC 81 MG tablet  Take 81 mg by mouth at bedtime.     HYDROcodone-acetaminophen 5-325 MG per tablet  Commonly known as:  NORCO/VICODIN  Take 1-2 tablets by mouth every 4 (four) hours as needed (pain).     INCRUSE ELLIPTA 62.5 MCG/INH Aepb  Generic drug:  Umeclidinium Bromide  Inhale 1 puff into the lungs daily.     losartan-hydrochlorothiazide 100-25 MG per tablet  Commonly known as:  HYZAAR   Take 1 tablet by mouth daily.     metoprolol 100 MG tablet  Commonly known as:  LOPRESSOR  Take 100 mg by mouth 2 (two) times daily.     multivitamin with minerals Tabs tablet  Take 1 tablet by mouth daily. Centrum Silver     nortriptyline 10 MG capsule  Commonly known as:  PAMELOR  Take 10 mg by mouth at bedtime.     omeprazole 20 MG tablet  Commonly known as:  PRILOSEC OTC  Take 20 mg by mouth daily.     SENNA PLUS 8.6-50 MG per tablet  Generic drug:  senna-docusate  Take 1 tablet by mouth daily as needed for mild constipation.     simvastatin 20 MG tablet  Commonly known as:  ZOCOR  Take 20 mg by mouth at bedtime.     SUPER B COMPLEX/VITAMIN C Tabs  Take 1 tablet by mouth daily.  Follow-up Information    Schedule an appointment as soon as possible for a visit with Paulene Floor, MD.   Specialty:  Orthopedic Surgery   Why:  For wound re-check, For suture removal   Contact information:   [address] [place] [address] [phone]       Signed: Ivan Croft 05/06/2015, 12:48 PM

## 2015-05-07 ENCOUNTER — Encounter (HOSPITAL_COMMUNITY): Payer: Self-pay | Admitting: Orthopedic Surgery

## 2015-05-07 ENCOUNTER — Ambulatory Visit: Payer: Medicare Other | Admitting: Family

## 2015-05-07 LAB — URINE CULTURE: CULTURE: NO GROWTH

## 2015-05-08 ENCOUNTER — Other Ambulatory Visit: Payer: Self-pay | Admitting: *Deleted

## 2015-05-08 ENCOUNTER — Telehealth: Payer: Self-pay | Admitting: Family

## 2015-05-08 DIAGNOSIS — I6523 Occlusion and stenosis of bilateral carotid arteries: Secondary | ICD-10-CM

## 2015-05-08 DIAGNOSIS — I739 Peripheral vascular disease, unspecified: Secondary | ICD-10-CM

## 2015-05-08 DIAGNOSIS — Z9889 Other specified postprocedural states: Secondary | ICD-10-CM

## 2015-05-08 NOTE — Telephone Encounter (Signed)
-----   Message from Viann Fish, NP sent at 05/07/2015  5:45 PM EDT ----- Regarding: follow up Jeanene Erb,  I spoke with pt's daughter, Stephania Fragmin, by phone today as she called and wanted pt vascular lab results from 05-03-15 since pt fell on 05-05-15 and fractured her elbow, had an ORIF of elbow.  Pt needs to follow up in a year with carotid Duplex, ABI's, and left LE arterial Duplex. Daughter would like to be called on Friday, 05/11/15 to schedule follow up appointment, or daughter will call our office on 05/11/15 to schedule follow up for a year.  Daughter's phone # is [phone].  Indication for carotid Duplex is bilateral internal ICA stenosis. Indications for left LE arterial Duplex and ABI's  are left mid superficial femoral to posterior tibial bypass graft and peripheral arterial occlusive disease.  Thank you, Vinnie Level

## 2015-05-08 NOTE — Telephone Encounter (Signed)
-----   Message from Mena Goes, RN sent at 05/08/2015  9:30 AM EDT ----- Regarding: RE: follow up I put the orders in as Carotid Duplex, ABIs and LLE bypass graft duplex.    ----- Message -----    From: Viann Fish, NP    Sent: 05/07/2015   5:45 PM      To: Remo Lipps, RN, # Subject: follow up                                      Jeanene Erb,  I spoke with pt's daughter, Stephania Fragmin, by phone today as she called and wanted pt vascular lab results from 05-03-15 since pt fell on 05-05-15 and fractured her elbow, had an ORIF of elbow.  Pt needs to follow up in a year with carotid Duplex, ABI's, and left LE arterial Duplex. Daughter would like to be called on Friday, 05/11/15 to schedule follow up appointment, or daughter will call our office on 05/11/15 to schedule follow up for a year.  Daughter's phone # is [phone].  Indication for carotid Duplex is bilateral internal ICA stenosis. Indications for left LE arterial Duplex and ABI's  are left mid superficial femoral to posterior tibial bypass graft and peripheral arterial occlusive disease.  Thank you, Vinnie Level

## 2015-05-08 NOTE — Telephone Encounter (Signed)
Waiting for pts daughter to call and schedule appointments as indicated below.

## 2015-05-11 NOTE — Telephone Encounter (Signed)
-----   Message from Viann Fish, NP sent at 05/07/2015  5:45 PM EDT ----- Regarding: follow up Chelsey Campbell,  I spoke with pt's daughter, Stephania Fragmin, by phone today as she called and wanted pt vascular lab results from 05-03-15 since pt fell on 05-05-15 and fractured her elbow, had an ORIF of elbow.  Pt needs to follow up in a year with carotid Duplex, ABI's, and left LE arterial Duplex. Daughter would like to be called on Friday, 05/11/15 to schedule follow up appointment, or daughter will call our office on 05/11/15 to schedule follow up for a year.  Daughter's phone # is [phone].  Indication for carotid Duplex is bilateral internal ICA stenosis. Indications for left LE arterial Duplex and ABI's  are left mid superficial femoral to posterior tibial bypass graft and peripheral arterial occlusive disease.  Thank you, Vinnie Level

## 2015-05-11 NOTE — Telephone Encounter (Signed)
Appointments scheduled with daughter, Mrs. Hutton.  Reminder mailed.

## 2015-05-15 ENCOUNTER — Emergency Department (HOSPITAL_COMMUNITY): Payer: Medicare Other

## 2015-05-15 ENCOUNTER — Encounter (HOSPITAL_COMMUNITY): Payer: Self-pay | Admitting: General Practice

## 2015-05-15 ENCOUNTER — Inpatient Hospital Stay (HOSPITAL_COMMUNITY)
Admission: EM | Admit: 2015-05-15 | Discharge: 2015-05-22 | DRG: 683 | Disposition: A | Discharge status: Skilled Nursing Facility | Payer: Medicare Other | Attending: Internal Medicine | Admitting: Internal Medicine

## 2015-05-15 DIAGNOSIS — E663 Overweight: Secondary | ICD-10-CM | POA: Diagnosis present

## 2015-05-15 DIAGNOSIS — Z9889 Other specified postprocedural states: Secondary | ICD-10-CM

## 2015-05-15 DIAGNOSIS — D509 Iron deficiency anemia, unspecified: Secondary | ICD-10-CM | POA: Diagnosis present

## 2015-05-15 DIAGNOSIS — E860 Dehydration: Secondary | ICD-10-CM | POA: Diagnosis present

## 2015-05-15 DIAGNOSIS — E8770 Fluid overload, unspecified: Secondary | ICD-10-CM | POA: Diagnosis not present

## 2015-05-15 DIAGNOSIS — IMO0002 Reserved for concepts with insufficient information to code with codable children: Secondary | ICD-10-CM

## 2015-05-15 DIAGNOSIS — I129 Hypertensive chronic kidney disease with stage 1 through stage 4 chronic kidney disease, or unspecified chronic kidney disease: Secondary | ICD-10-CM | POA: Diagnosis present

## 2015-05-15 DIAGNOSIS — W19XXXA Unspecified fall, initial encounter: Secondary | ICD-10-CM | POA: Diagnosis present

## 2015-05-15 DIAGNOSIS — Z950 Presence of cardiac pacemaker: Secondary | ICD-10-CM | POA: Diagnosis present

## 2015-05-15 DIAGNOSIS — Z833 Family history of diabetes mellitus: Secondary | ICD-10-CM

## 2015-05-15 DIAGNOSIS — N183 Chronic kidney disease, stage 3 (moderate): Secondary | ICD-10-CM | POA: Diagnosis present

## 2015-05-15 DIAGNOSIS — N179 Acute kidney failure, unspecified: Secondary | ICD-10-CM | POA: Diagnosis not present

## 2015-05-15 DIAGNOSIS — J449 Chronic obstructive pulmonary disease, unspecified: Secondary | ICD-10-CM | POA: Diagnosis present

## 2015-05-15 DIAGNOSIS — I6529 Occlusion and stenosis of unspecified carotid artery: Secondary | ICD-10-CM | POA: Diagnosis present

## 2015-05-15 DIAGNOSIS — Z792 Long term (current) use of antibiotics: Secondary | ICD-10-CM

## 2015-05-15 DIAGNOSIS — Y92009 Unspecified place in unspecified non-institutional (private) residence as the place of occurrence of the external cause: Secondary | ICD-10-CM

## 2015-05-15 DIAGNOSIS — Z87891 Personal history of nicotine dependence: Secondary | ICD-10-CM

## 2015-05-15 DIAGNOSIS — E878 Other disorders of electrolyte and fluid balance, not elsewhere classified: Secondary | ICD-10-CM | POA: Diagnosis not present

## 2015-05-15 DIAGNOSIS — Z8249 Family history of ischemic heart disease and other diseases of the circulatory system: Secondary | ICD-10-CM

## 2015-05-15 DIAGNOSIS — Z66 Do not resuscitate: Secondary | ICD-10-CM | POA: Diagnosis present

## 2015-05-15 DIAGNOSIS — R410 Disorientation, unspecified: Secondary | ICD-10-CM | POA: Diagnosis not present

## 2015-05-15 DIAGNOSIS — E785 Hyperlipidemia, unspecified: Secondary | ICD-10-CM | POA: Diagnosis present

## 2015-05-15 DIAGNOSIS — Z8673 Personal history of transient ischemic attack (TIA), and cerebral infarction without residual deficits: Secondary | ICD-10-CM

## 2015-05-15 DIAGNOSIS — F0390 Unspecified dementia without behavioral disturbance: Secondary | ICD-10-CM | POA: Diagnosis present

## 2015-05-15 DIAGNOSIS — Z7982 Long term (current) use of aspirin: Secondary | ICD-10-CM

## 2015-05-15 DIAGNOSIS — Z809 Family history of malignant neoplasm, unspecified: Secondary | ICD-10-CM

## 2015-05-15 DIAGNOSIS — R5381 Other malaise: Secondary | ICD-10-CM | POA: Diagnosis present

## 2015-05-15 DIAGNOSIS — E871 Hypo-osmolality and hyponatremia: Secondary | ICD-10-CM | POA: Diagnosis present

## 2015-05-15 DIAGNOSIS — S0101XA Laceration without foreign body of scalp, initial encounter: Secondary | ICD-10-CM | POA: Diagnosis present

## 2015-05-15 DIAGNOSIS — E872 Acidosis: Secondary | ICD-10-CM | POA: Diagnosis present

## 2015-05-15 DIAGNOSIS — E875 Hyperkalemia: Secondary | ICD-10-CM | POA: Diagnosis present

## 2015-05-15 DIAGNOSIS — M25531 Pain in right wrist: Secondary | ICD-10-CM | POA: Diagnosis present

## 2015-05-15 DIAGNOSIS — Z951 Presence of aortocoronary bypass graft: Secondary | ICD-10-CM

## 2015-05-15 DIAGNOSIS — I2510 Atherosclerotic heart disease of native coronary artery without angina pectoris: Secondary | ICD-10-CM | POA: Diagnosis present

## 2015-05-15 DIAGNOSIS — I4891 Unspecified atrial fibrillation: Secondary | ICD-10-CM | POA: Diagnosis present

## 2015-05-15 DIAGNOSIS — Z953 Presence of xenogenic heart valve: Secondary | ICD-10-CM

## 2015-05-15 DIAGNOSIS — Z6832 Body mass index (BMI) 32.0-32.9, adult: Secondary | ICD-10-CM

## 2015-05-15 DIAGNOSIS — K219 Gastro-esophageal reflux disease without esophagitis: Secondary | ICD-10-CM | POA: Diagnosis present

## 2015-05-15 DIAGNOSIS — Z79891 Long term (current) use of opiate analgesic: Secondary | ICD-10-CM

## 2015-05-15 DIAGNOSIS — R0602 Shortness of breath: Secondary | ICD-10-CM

## 2015-05-15 DIAGNOSIS — R296 Repeated falls: Secondary | ICD-10-CM | POA: Diagnosis present

## 2015-05-15 DIAGNOSIS — I739 Peripheral vascular disease, unspecified: Secondary | ICD-10-CM | POA: Diagnosis present

## 2015-05-15 DIAGNOSIS — Z79899 Other long term (current) drug therapy: Secondary | ICD-10-CM

## 2015-05-15 DIAGNOSIS — E041 Nontoxic single thyroid nodule: Secondary | ICD-10-CM

## 2015-05-15 DIAGNOSIS — Z955 Presence of coronary angioplasty implant and graft: Secondary | ICD-10-CM

## 2015-05-15 LAB — URINALYSIS, ROUTINE W REFLEX MICROSCOPIC
Glucose, UA: NEGATIVE mg/dL
HGB URINE DIPSTICK: NEGATIVE
KETONES UR: NEGATIVE mg/dL
Leukocytes, UA: NEGATIVE
NITRITE: NEGATIVE
Protein, ur: NEGATIVE mg/dL
SPECIFIC GRAVITY, URINE: 1.024 (ref 1.005–1.030)
Urobilinogen, UA: 0.2 mg/dL (ref 0.0–1.0)
pH: 5 (ref 5.0–8.0)

## 2015-05-15 LAB — CBC WITH DIFFERENTIAL/PLATELET
Basophils Absolute: 0 10*3/uL (ref 0.0–0.1)
Basophils Relative: 0 % (ref 0–1)
EOS ABS: 0.1 10*3/uL (ref 0.0–0.7)
EOS PCT: 1 % (ref 0–5)
HCT: 34 % — ABNORMAL LOW (ref 36.0–46.0)
HEMOGLOBIN: 11.6 g/dL — AB (ref 12.0–15.0)
LYMPHS ABS: 0.7 10*3/uL (ref 0.7–4.0)
Lymphocytes Relative: 7 % — ABNORMAL LOW (ref 12–46)
MCH: 30.7 pg (ref 26.0–34.0)
MCHC: 34.1 g/dL (ref 30.0–36.0)
MCV: 89.9 fL (ref 78.0–100.0)
MONOS PCT: 2 % — AB (ref 3–12)
Monocytes Absolute: 0.3 10*3/uL (ref 0.1–1.0)
Neutro Abs: 9.8 10*3/uL — ABNORMAL HIGH (ref 1.7–7.7)
Neutrophils Relative %: 90 % — ABNORMAL HIGH (ref 43–77)
PLATELETS: 229 10*3/uL (ref 150–400)
RBC: 3.78 MIL/uL — ABNORMAL LOW (ref 3.87–5.11)
RDW: 15.9 % — ABNORMAL HIGH (ref 11.5–15.5)
WBC: 11.4 10*3/uL — ABNORMAL HIGH (ref 4.0–10.5)

## 2015-05-15 LAB — I-STAT CHEM 8, ED
BUN: 44 mg/dL — ABNORMAL HIGH (ref 6–20)
CREATININE: 2.7 mg/dL — AB (ref 0.44–1.00)
Calcium, Ion: 1.12 mmol/L — ABNORMAL LOW (ref 1.13–1.30)
Chloride: 104 mmol/L (ref 101–111)
GLUCOSE: 120 mg/dL — AB (ref 65–99)
HCT: 37 % (ref 36.0–46.0)
HEMOGLOBIN: 12.6 g/dL (ref 12.0–15.0)
POTASSIUM: 5.5 mmol/L — AB (ref 3.5–5.1)
Sodium: 130 mmol/L — ABNORMAL LOW (ref 135–145)
TCO2: 19 mmol/L (ref 0–100)

## 2015-05-15 LAB — CBC
HCT: 30.8 % — ABNORMAL LOW (ref 36.0–46.0)
Hemoglobin: 10.3 g/dL — ABNORMAL LOW (ref 12.0–15.0)
MCH: 30 pg (ref 26.0–34.0)
MCHC: 33.4 g/dL (ref 30.0–36.0)
MCV: 89.8 fL (ref 78.0–100.0)
Platelets: 193 10*3/uL (ref 150–400)
RBC: 3.43 MIL/uL — AB (ref 3.87–5.11)
RDW: 15.8 % — ABNORMAL HIGH (ref 11.5–15.5)
WBC: 9.5 10*3/uL (ref 4.0–10.5)

## 2015-05-15 LAB — CREATININE, SERUM
CREATININE: 2.54 mg/dL — AB (ref 0.44–1.00)
GFR calc Af Amer: 19 mL/min — ABNORMAL LOW (ref >60)
GFR calc non Af Amer: 16 mL/min — ABNORMAL LOW (ref >60)

## 2015-05-15 MED ORDER — METOPROLOL TARTRATE 100 MG PO TABS
100.0000 mg | ORAL_TABLET | Freq: Two times a day (BID) | ORAL | Status: DC
  Administered 2015-05-15 – 2015-05-17 (×5): 100 mg via ORAL
  Filled 2015-05-15 (×7): qty 1

## 2015-05-15 MED ORDER — SODIUM CHLORIDE 0.9 % IV BOLUS (SEPSIS)
1000.0000 mL | Freq: Once | INTRAVENOUS | Status: AC
  Administered 2015-05-15: 1000 mL via INTRAVENOUS

## 2015-05-15 MED ORDER — LIDOCAINE-EPINEPHRINE 1 %-1:100000 IJ SOLN
10.0000 mL | Freq: Once | INTRAMUSCULAR | Status: AC
  Administered 2015-05-15: 10 mL via INTRADERMAL
  Filled 2015-05-15: qty 1

## 2015-05-15 MED ORDER — NORTRIPTYLINE HCL 10 MG PO CAPS
10.0000 mg | ORAL_CAPSULE | Freq: Every day | ORAL | Status: DC
  Administered 2015-05-15 – 2015-05-21 (×7): 10 mg via ORAL
  Filled 2015-05-15 (×8): qty 1

## 2015-05-15 MED ORDER — SODIUM CHLORIDE 0.9 % IV SOLN
INTRAVENOUS | Status: AC
  Administered 2015-05-15: 18:00:00 via INTRAVENOUS

## 2015-05-15 MED ORDER — SODIUM CHLORIDE 0.9 % IV SOLN
INTRAVENOUS | Status: DC
  Administered 2015-05-16: 100 mL/h via INTRAVENOUS

## 2015-05-15 MED ORDER — ALBUTEROL SULFATE HFA 108 (90 BASE) MCG/ACT IN AERS
2.0000 | INHALATION_SPRAY | RESPIRATORY_TRACT | Status: DC | PRN
  Administered 2015-05-15: 2 via RESPIRATORY_TRACT
  Filled 2015-05-15: qty 6.7

## 2015-05-15 MED ORDER — FENTANYL CITRATE (PF) 100 MCG/2ML IJ SOLN
50.0000 ug | Freq: Once | INTRAMUSCULAR | Status: AC
  Administered 2015-05-15: 50 ug via INTRAVENOUS
  Filled 2015-05-15: qty 2

## 2015-05-15 MED ORDER — SODIUM CHLORIDE 0.9 % IJ SOLN
3.0000 mL | Freq: Two times a day (BID) | INTRAMUSCULAR | Status: DC
  Administered 2015-05-15 – 2015-05-20 (×3): 3 mL via INTRAVENOUS

## 2015-05-15 MED ORDER — HEPARIN SODIUM (PORCINE) 5000 UNIT/ML IJ SOLN
5000.0000 [IU] | Freq: Three times a day (TID) | INTRAMUSCULAR | Status: DC
  Administered 2015-05-15 – 2015-05-21 (×17): 5000 [IU] via SUBCUTANEOUS
  Filled 2015-05-15 (×20): qty 1

## 2015-05-15 MED ORDER — ALBUTEROL SULFATE (2.5 MG/3ML) 0.083% IN NEBU
2.5000 mg | INHALATION_SOLUTION | RESPIRATORY_TRACT | Status: DC | PRN
  Administered 2015-05-16 – 2015-05-21 (×10): 2.5 mg via RESPIRATORY_TRACT
  Filled 2015-05-15 (×14): qty 3

## 2015-05-15 MED ORDER — ASPIRIN EC 81 MG PO TBEC
81.0000 mg | DELAYED_RELEASE_TABLET | Freq: Every day | ORAL | Status: DC
  Administered 2015-05-15 – 2015-05-21 (×7): 81 mg via ORAL
  Filled 2015-05-15 (×8): qty 1

## 2015-05-15 MED ORDER — AEROCHAMBER PLUS W/MASK MISC
1.0000 | Freq: Once | Status: AC
  Administered 2015-05-15: 1
  Filled 2015-05-15: qty 1

## 2015-05-15 MED ORDER — HYDROCODONE-ACETAMINOPHEN 5-325 MG PO TABS
1.0000 | ORAL_TABLET | ORAL | Status: DC | PRN
  Administered 2015-05-15 – 2015-05-17 (×5): 1 via ORAL
  Filled 2015-05-15 (×5): qty 1

## 2015-05-15 MED ORDER — AMLODIPINE BESYLATE 2.5 MG PO TABS
2.5000 mg | ORAL_TABLET | Freq: Every day | ORAL | Status: DC
  Administered 2015-05-15 – 2015-05-17 (×3): 2.5 mg via ORAL
  Filled 2015-05-15 (×4): qty 1

## 2015-05-15 MED ORDER — UMECLIDINIUM BROMIDE 62.5 MCG/INH IN AEPB: 1.0000 | INHALATION_SPRAY | Freq: Every day | RESPIRATORY_TRACT | Status: DC

## 2015-05-15 NOTE — ED Notes (Signed)
Pt brought in after a fall at home. Pt fell around 1130 this morning and landed on a wood floor. Pt fell backwards and hit her head and her right wrist. Pt is A/O. Pt rating pain a 9/10.

## 2015-05-15 NOTE — ED Provider Notes (Signed)
LACERATION REPAIR Performed by: Brent [facility] Authorized by: [facility] Consent: Verbal consent obtained. Risks and benefits: risks, benefits and alternatives were discussed Consent given by: patient Patient identity confirmed: provided demographic data Prepped and Draped in normal sterile fashion Wound explored  Laceration Location: mid occipital region   Laceration Length: 3cm  No Foreign Bodies seen or palpated  Anesthesia: local infiltration  Local anesthetic: lidocaine 2% w epinephrine  Anesthetic total: 4 ml  Irrigation method: syringe Amount of cleaning: standard  Skin closure: Staples  Number of sutures: 5  Technique: staples  Patient tolerance: Patient tolerated the procedure well with no immediate complications.   Dalia Heading, PA-C 05/15/15 [place], MD 05/15/15 [phone]

## 2015-05-15 NOTE — ED Notes (Signed)
Pt in CT.

## 2015-05-15 NOTE — Care Management Note (Signed)
Case Management Note  Patient Details  Name: Chelsey Campbell MRN: [id number] Date of Birth: 01/22/1928  Subjective/Objective:      Pt brought in after a fall at home. Pt fell around 1130 this morning and landed on a [name] floor. Pt fell backwards and hit her head and her right wrist. //Home with adult daughter Antony Salmon).              Action/Plan: X-ray//Access for disposition needs.  Expected Discharge Date:     05/15/2015             Expected Discharge Plan:  [place]  In-House Referral:  NA  Discharge planning Services  CM Consult  Post Acute Care Choice:  Home Health, Durable Medical Equipment Choice offered to:  Patient, Adult Children  DME Arranged:  Wheelchair manual DME Agency:  [place]:  RN, PT, Nurse's Aide Trail Agency:  [place]  Status of Service:  Completed, signed off  Medicare Important Message Given:    Date Medicare IM Given:    Medicare IM give by:    Date Additional Medicare IM Given:    Additional Medicare Important Message give by:     If discussed at [place] of Stay Meetings, dates discussed:    Additional Comments: Alferd Obryant J. Clydene Laming, RN, BSN, [employer] [phone] Spoke with pt at bedside regarding discharge planning for [facility]. Offered pt list of home health agencies to choose from.  Pt chose [facility] to render services. Santiago Glad, RN of [facility] notified.  DME needs identified at this time include wheelchair.  NCM called [facility] of [place] to deliver to pt room prior to d/c home.  Fuller Mandril, RN 05/15/2015, 1:59 PM

## 2015-05-15 NOTE — H&P (Signed)
[facility] History and Physical  Chelsey Campbell JOI:[id number] DOB: 1928/01/09 DOA: 05/15/2015  Referring physician: ED PCP: [name], MD  Specialists: none   81 y/o ? s/p s/p AVR 23 mm [employer] Freestlye Aortic Bioprosthesis 02/13/12, s/p BMS 12/2011, s/p PPM [employer] 10/14/10 for tachybradycardia syndrome, s/p Lext Arterial bypass 2009, last ECHO 03/20/15 at [facility] EF 60%. She has had multiple recent falls at home for the past 2-3 months. She seems completely dependent on ADLs and has lived with her daughter for the past 4 years in [place]. Over the same period time she has had progressive memory loss and fluctuating mental status She has an almost blind right eye and sees poorly out of the left eye She recently was admitted to [facility] under the care of Dr. Amedeo Plenty as she had a fall and sustained a left olecranon + radial fracture-she was discharged home with pain medications and Bactrim for an abrasion noted on her elbow  She represented to [facility] 05/15/15 with accidental fall and injury to the head which was repaired by the emergency room. She has abrasions over her arms She is slightly confused and some of the history is taken from her daughter who gives most of it freely  Daughter states that she has been confused and somewhat agitated. Her baseline is that moves slowly and uses an assistive device  She has not had any recent fever or chills or any other issues, no dark stool no tarry stool no chest pain no nausea no vomiting recently. She has been drinking very little according to the daughter only one 12 ounce bottle day before yesterday and yesterday and has been constipated and was given Senokot because she is on pain medications for recent hand fracture. She states to me herself the patient that she is drinking a lot whereas the daughter says the opposite  Workup in the emergency room Multiple x-rays revealing no acute  fracture Urine/creatinine 40/2.7 baseline around 39/1.6, Sodium 130, potassium 5.5, WBC 11.4, platelets 229, CT scan suggestive of narrowing of the carotid arteries  Never smoker never drinker Lives with daughter currently   No known drug allergies      Past Medical History  Diagnosis Date  . Hypertension   . Hyperlipidemia   . Insomnia   . Overweight(278.02)   . Vaginal prolapse     with pesary  . S/P cardiac pacemaker procedure November 2011    for tachy/brady syndrome  . Peripheral vascular disease     S/P  femoral bypass  . History of TIA (transient ischemic attack) 2011    TIA episode without an actual TIA   . Bursitis     in joints - hips/neck  . Aortic stenosis, severe   . Ventricular dysfunction     Left mild with EF 45-50%  . CHF (congestive heart failure)   . COPD (chronic obstructive pulmonary disease)   . SVD (spontaneous vaginal delivery)     x 3  . Renal insufficiency     borderline - r/t bp - Dr Mercy Moore at [facility]  . Pacemaker     [employer]  . Shortness of breath     occasionally with exertion  . Heart murmur   . GERD (gastroesophageal reflux disease)   . Anemia     history  . CAD (coronary artery disease)   . Atrial fibrillation   . Borderline diabetes   . Arthritis    Past Surgical History  Procedure Laterality Date  . Eye surgery       Bilateral Cataracts  . Coronary artery bypass graft  2013    replaced aortic valve  . Pad  2009    vein right arm to left leg   . Wrist surgery  2005    fell broke both wrists and had reconstrictive surgery on both wrists  . Cardiac valve replacement  2013    aorta replacement w/ stent  . Insert / replace / remove pacemaker  2011    [employer]  . Laparoscopic assisted vaginal hysterectomy N/A 01/12/2013    Procedure: LAPAROSCOPIC ASSISTED VAGINAL HYSTERECTOMY;  Surgeon: Maeola Sarah. Landry Mellow, MD;  Location: [place] [facility];  Service: Gynecology;  Laterality: N/A;  . Salpingoophorectomy  Bilateral 01/12/2013    Procedure: SALPINGO OOPHORECTOMY;  Surgeon: Maeola Sarah. Landry Mellow, MD;  Location: [place] [facility];  Service: Gynecology;  Laterality: Bilateral;  . Cystocele repair N/A 01/12/2013    Procedure: ANTERIOR REPAIR (CYSTOCELE);  Surgeon: Maeola Sarah. Landry Mellow, MD;  Location: [place] [facility];  Service: Gynecology;  Laterality: N/A;  . Cystoscopy N/A 01/12/2013    Procedure: CYSTOSCOPY;  Surgeon: Maeola Sarah. Landry Mellow, MD;  Location: [place] [facility];  Service: Gynecology;  Laterality: N/A;  . Abdominal hysterectomy  01-12-13  . Orif ulnar fracture Left 05/05/2015    Procedure: OPEN REDUCTION INTERNAL FIXATION (ORIF) ULNAR FRACTURE;  Surgeon: Roseanne Kaufman, MD;  Location: [place];  Service: Orthopedics;  Laterality: Left;   Social History:  History   Social History Narrative    Allergies  Allergen Reactions  . Procaine Hcl Anaphylaxis  . Morphine And Related Other (See Comments)    Patient gets disoriented/lost memory and doesn't tolerate it well.  Patient does not want morphine used.  . Ciprofloxacin Other (See Comments)    Infusion site reaction/ burning and redness  . Labetalol Hcl Other (See Comments)    Affects kidneys  . Penicillins Swelling, Rash and Other (See Comments)    blisters    Family History  Problem Relation Age of Onset  . Heart disease Father   . Heart attack Father 18  . Diabetes Father   . Cancer Sister     brain  . Heart disease Brother   . Diabetes Daughter   . Hyperlipidemia Son     Prior to Admission medications   Medication Sig Start Date End Date Taking? Authorizing Provider  amLODipine (NORVASC) 2.5 MG tablet Take 2.5 mg by mouth daily. 07/01/13  Yes Historical Provider, MD  losartan-hydrochlorothiazide (HYZAAR) 100-25 MG per tablet Take 1 tablet by mouth daily. 08/27/13  Yes Historical Provider, MD  tiotropium (SPIRIVA HANDIHALER) 18 MCG inhalation capsule Place 18 mcg into inhaler and inhale.  09/08/13  Yes Historical Provider, MD  acetaminophen (TYLENOL) 500 MG tablet Take 500 mg by  mouth every 6 (six) hours as needed (pain).    Historical Provider, MD  albuterol (ACCUNEB) 0.63 MG/3ML nebulizer solution Inhale 1 ampule into the lungs.     Historical Provider, MD  albuterol (PROVENTIL HFA;VENTOLIN HFA) 108 (90 BASE) MCG/ACT inhaler Inhale 1-2 puffs into the lungs See admin instructions. Inhale 1 puff daily at bedtime, may also use once during the day as needed for shortness of breath    Historical Provider, MD  amLODipine (NORVASC) 2.5 MG tablet Take 2.5 mg by mouth at bedtime. 03/23/15   Historical Provider, MD  aspirin EC 81 MG tablet Take 81 mg by mouth at bedtime.    Historical Provider, MD  aspirin EC 81 MG tablet  Take 81 mg by mouth daily.    Historical Provider, MD  b complex vitamins capsule Take 1 capsule by mouth daily.    Historical Provider, MD  B Complex-C (SUPER B COMPLEX/VITAMIN C) TABS Take 1 tablet by mouth daily.     Historical Provider, MD  HYDROcodone-acetaminophen (NORCO/VICODIN) 5-325 MG per tablet Take 1-2 tablets by mouth every 4 (four) hours as needed (pain).    Historical Provider, MD  losartan-hydrochlorothiazide (HYZAAR) 100-25 MG per tablet Take 1 tablet by mouth daily.  06/06/13   Historical Provider, MD  metoprolol (LOPRESSOR) 100 MG tablet Take 100 mg by mouth 2 (two) times daily.    Historical Provider, MD  metoprolol (LOPRESSOR) 100 MG tablet Take 100 mg by mouth 2 (two) times daily.    Historical Provider, MD  Multiple Vitamin (MULTIVITAMIN WITH MINERALS) TABS tablet Take 1 tablet by mouth daily. Centrum Silver    Historical Provider, MD  nortriptyline (PAMELOR) 10 MG capsule Take 10 mg by mouth at bedtime.    Historical Provider, MD  nortriptyline (PAMELOR) 10 MG capsule Take 10 mg by mouth at bedtime.    Historical Provider, MD  omeprazole (PRILOSEC OTC) 20 MG tablet Take 20 mg by mouth daily.    Historical Provider, MD  omeprazole (PRILOSEC) 40 MG capsule Take 40 mg by mouth daily.    Historical Provider, MD  senna-docusate (SENNA PLUS) 8.6-50  MG per tablet Take 1 tablet by mouth daily as needed for mild constipation.    Historical Provider, MD  simvastatin (ZOCOR) 20 MG tablet Take 20 mg by mouth at bedtime.    Historical Provider, MD  simvastatin (ZOCOR) 20 MG tablet Take 20 mg by mouth at bedtime.    Historical Provider, MD  sulfamethoxazole-trimethoprim (BACTRIM DS,SEPTRA DS) 800-160 MG per tablet Take 1 tablet by mouth.  05/04/15   Historical Provider, MD  Umeclidinium Bromide (INCRUSE ELLIPTA) 62.5 MCG/INH AEPB Inhale 1 puff into the lungs daily.    Historical Provider, MD   Physical Exam: Filed Vitals:   05/15/15 1630 05/15/15 1652 05/15/15 1712 05/15/15 1745  BP: 119/90 129/56 115/81 111/75  Pulse: 59 60 63 60  Temp:    98.9 F (37.2 C)  TempSrc:    Oral  Resp: 20 21 23 17   Height:      Weight:      SpO2: 99% 97% 93% 100%    Pleasant alert but slightly confused about date and time is talkative and does not seem to wish to engage much with this examiner EOMI NCAT Throat soft supple? Enlarged thyroid, no JVD, Mallampati 4 Clinically clear no added sound S1-S2 no murmur rub or gallop, pacemaker left upper chest Abdomen soft nontender nondistended no rebound Neurologically intact moving all 4 limbs equally Abrasion to back of head has 4 staples in it 3 cm long Obese No lower extremity edema  Labs on Admission:  Basic Metabolic Panel:  Recent Labs Lab 05/15/15 1450  NA 130*  K 5.5*  CL 104  GLUCOSE 120*  BUN 44*  CREATININE 2.70*   Liver Function Tests: No results for input(s): AST, ALT, ALKPHOS, BILITOT, PROT, ALBUMIN in the last 168 hours. No results for input(s): LIPASE, AMYLASE in the last 168 hours. No results for input(s): AMMONIA in the last 168 hours. CBC:  Recent Labs Lab 05/15/15 1315 05/15/15 1450  WBC 11.4*  --   NEUTROABS 9.8*  --   HGB 11.6* 12.6  HCT 34.0* 37.0  MCV 89.9  --   PLT  229  --    Cardiac Enzymes: No results for input(s): CKTOTAL, CKMB, CKMBINDEX, TROPONINI in the  last 168 hours.  BNP (last 3 results) No results for input(s): BNP in the last 8760 hours.  ProBNP (last 3 results) No results for input(s): PROBNP in the last 8760 hours.  CBG: No results for input(s): GLUCAP in the last 168 hours.  Radiological Exams on Admission: Dg Forearm Left  05/15/2015   CLINICAL DATA:  Pain status post fall.  EXAM: LEFT FOREARM - 2 VIEW  COMPARISON:  05/03/2015  FINDINGS: There has been an interval plate and screw fixation of the previously demonstrated proximal ulnar fracture, and distal radial fracture. There is some callus formation. Comminution of the olecranon is noted. There is decreased soft tissue swelling. There is no evidence of new acute fracture or dislocation. Cast material obscures detailed evaluation of the soft tissues.  IMPRESSION: Status post plate and screw fixation of proximal ulnar and distal radial fractures, with evidence of minimal healing.  No evidence of new acute fracture or subluxation.   Electronically Signed   By: Fidela Salisbury M.D.   On: 05/15/2015 15:02   Dg Wrist Complete Right  05/15/2015   CLINICAL DATA:  Pain following fall  EXAM: RIGHT WRIST - COMPLETE 3+ VIEW  COMPARISON:  None.  FINDINGS: Frontal, oblique, lateral, and ulnar deviation scaphoid images were obtained. There is postoperative change in the distal radius with screw and plate fixation in this area. There is chronic avulsion of the ulnar styloid with a bony fragment noted in the triangular fibrocartilage. There is no acute fracture or dislocation. There is moderate narrowing of the radiocarpal joint as well as the scaphotrapezial joint. No erosive change.  IMPRESSION: Evidence of old trauma with remodeling in the distal radius and chronic avulsion of the ulnar styloid. Areas of osteoarthritic change. No acute fracture or dislocation.   Electronically Signed   By: Lowella Grip III M.D.   On: 05/15/2015 14:56   Ct Head Wo Contrast  05/15/2015   CLINICAL DATA:   Patient fell hitting back of head. Several recent syncopal episodes  EXAM: CT HEAD WITHOUT CONTRAST  CT CERVICAL SPINE WITHOUT CONTRAST  TECHNIQUE: Multidetector CT imaging of the head and cervical spine was performed following the standard protocol without intravenous contrast. Multiplanar CT image reconstructions of the cervical spine were also generated.  COMPARISON:  None.  FINDINGS: CT HEAD FINDINGS  There is moderate diffuse atrophy. There is no intracranial mass, hemorrhage, extra-axial fluid collection, or midline shift. There is patchy small vessel disease in the centra semiovale bilaterally. Elsewhere gray-white compartments appear normal. There is no acute infarct. The bony calvarium appears intact. The mastoid air cells are clear.  CT CERVICAL SPINE FINDINGS  There is no demonstrable fracture. There is slight anterolisthesis of C3 on C4, felt to be due to underlying spondylosis. There is no other appreciable spondylolisthesis. Prevertebral soft tissues and predental space regions are normal. There is moderately severe disc space narrowing at C5-6 and C6-7. There is moderate narrowing at C3-4 and C4-5. There is facet hypertrophy at essentially all levels bilaterally. There is no disc extrusion or stenosis.  There is calcification in both carotid arteries. There is a focal area of calcification in the right lobe of the thyroid. Visualized lung apices show mild scarring.  IMPRESSION: CT head: Atrophy with periventricular small vessel disease. No intracranial mass, hemorrhage, or acute appearing infarct. No extra-axial fluid collections are identified.  CT cervical spine: No demonstrable fracture.  Slight spondylolisthesis at C3-4 is felt to be due to underlying spondylosis. There is multilevel osteoarthritic change. No disc extrusion or stenosis appreciable.  There is carotid artery calcification bilaterally. There appears to be fairly significant narrowing at the sites of calcification, particularly on the  left. Duplex carotid ultrasound may be helpful for further assessment in this regard. There is a small calcification in the right lobe of the thyroid of questionable significance.   Electronically Signed   By: Lowella Grip III M.D.   On: 05/15/2015 15:35   Ct Cervical Spine Wo Contrast  05/15/2015   CLINICAL DATA:  Patient fell hitting back of head. Several recent syncopal episodes  EXAM: CT HEAD WITHOUT CONTRAST  CT CERVICAL SPINE WITHOUT CONTRAST  TECHNIQUE: Multidetector CT imaging of the head and cervical spine was performed following the standard protocol without intravenous contrast. Multiplanar CT image reconstructions of the cervical spine were also generated.  COMPARISON:  None.  FINDINGS: CT HEAD FINDINGS  There is moderate diffuse atrophy. There is no intracranial mass, hemorrhage, extra-axial fluid collection, or midline shift. There is patchy small vessel disease in the centra semiovale bilaterally. Elsewhere gray-white compartments appear normal. There is no acute infarct. The bony calvarium appears intact. The mastoid air cells are clear.  CT CERVICAL SPINE FINDINGS  There is no demonstrable fracture. There is slight anterolisthesis of C3 on C4, felt to be due to underlying spondylosis. There is no other appreciable spondylolisthesis. Prevertebral soft tissues and predental space regions are normal. There is moderately severe disc space narrowing at C5-6 and C6-7. There is moderate narrowing at C3-4 and C4-5. There is facet hypertrophy at essentially all levels bilaterally. There is no disc extrusion or stenosis.  There is calcification in both carotid arteries. There is a focal area of calcification in the right lobe of the thyroid. Visualized lung apices show mild scarring.  IMPRESSION: CT head: Atrophy with periventricular small vessel disease. No intracranial mass, hemorrhage, or acute appearing infarct. No extra-axial fluid collections are identified.  CT cervical spine: No demonstrable  fracture. Slight spondylolisthesis at C3-4 is felt to be due to underlying spondylosis. There is multilevel osteoarthritic change. No disc extrusion or stenosis appreciable.  There is carotid artery calcification bilaterally. There appears to be fairly significant narrowing at the sites of calcification, particularly on the left. Duplex carotid ultrasound may be helpful for further assessment in this regard. There is a small calcification in the right lobe of the thyroid of questionable significance.   Electronically Signed   By: Lowella Grip III M.D.   On: 05/15/2015 15:35   Dg Humerus Left  05/15/2015   CLINICAL DATA:  81 year old female with pain and tenderness in the left upper extremity.  EXAM: LEFT HUMERUS - 2+ VIEW  COMPARISON:  Radiograph 05/03/2015  FINDINGS: Evaluation is limited due to superimposed dressing as well as absence of true lateral projection. There is internal fixation of the previously seen olecranon fracture with plate and screw. There is irregularity of the radial head compatible with previously seen fracture. The radial-capitellar alignment is preserved on the frontal projection. No definite new fracture identified. Evaluation of the distal humerus is limited on the lateral projection.  A left pectoral pacemaker device is partially visualized.  IMPRESSION: Interval placement of fixation plate and screw through the previously seen olecranon fracture. No new fracture identified.   Electronically Signed   By: Anner Crete M.D.   On: 05/15/2015 15:00   Dg Hand Complete Right  05/15/2015  CLINICAL DATA:  Fall.  Pain and tenderness in the right hand.  EXAM: RIGHT HAND - COMPLETE 3+ VIEW  COMPARISON:  05/15/2015 wrist radiographs  FINDINGS: Osteoarthritis noted with interphalangeal articular space narrowing. Spurring at the interphalangeal joint of the thumb.  Distal radial plate and screw fixator. Old nonunited fracture of the ulnar styloid.  Bony demineralization.  IMPRESSION: 1.  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 No compelling findings of acute bony fracture. 2. Bony demineralization.   Electronically Signed   By: Van Clines M.D.   On: 05/15/2015 14:54    EKG: Independently reviewed. wnl  Assessment/Plan Principal Problem:   AKI (acute kidney injury)-we will admit the patient hydrate her with IV saline and reassess labs in the morning-etiology could be ongoing use of Hyzaar in addition to accident which can sometimes cause an interstitial nephritis in some patients.  Hyperkalemia-likely secondary to volume depletion. Should resolve. 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 Her daughter tells me that this was interrogated 3 weeks ago. We will have it re-interrogated by the [facility] Scientific rep   I would hold on getting an echocardiogram at the present time as last one done as documented in my note was 60% as above   Peripheral vascular disease-she will need to continue her usual dose of aspirin 81 mg-   mild to moderate dementia-I would estimate heard MMSE is around 20-23.  She will need further neuropsychiatric testing as an outpatient. It may be best to try to as an outpatient discontinue her Pamelor if possible'   Hypertension continue amlodipine 2.5 daily, metoprolol 100 twice a day   GERD-continue Prilosec 20 daily   Hyperlipidemia-continue Zocor 20 daily   ? Asthma-no wheeze therefore held inhalers  DO NOT RESUSCITATE  Long discussion with daughter  Inpatient   Time spent: 4   Verlon Au [facility] Pager [phone] If 7PM-7AM, please contact night-coverage [email or web address] Password [facility] 05/15/2015, 6:49 PM

## 2015-05-15 NOTE — ED Notes (Signed)
In-Out cath performed by RN Andee Poles. Peri-care provided by EMT Erlene Quan.

## 2015-05-15 NOTE — ED Provider Notes (Signed)
CSN: [id number]     Arrival date & time 05/15/15  1215 History   First MD Initiated Contact with Patient 05/15/15 1254     Chief Complaint  Patient presents with  . Fall   History is obtained from patient and from patient's daughter  (Consider location/radiation/quality/duration/timing/severity/associated sxs/prior Treatment) HPI Patient fell this morning 11:30 AM when "her legs gave out on her." Patient and daughter report that her knees frequently buckle and she is prone to frequent falls her last fall was 05/03/2015 when she suffered a fracture to her left upper extremity. She denies lightheadedness prior to the fall denies syncope. She presently complains of right wrist pain right hand pain left upper arm pain and left forearm pain. She also struck her head as a result fall creating a laceration at her occipital scalp. Denies headache denies neck pain denies abdominal pain her daughter does report that she "had a rough night last night" meaning that she was confused however presently she appears of baseline mental status per her daughter. No treatment prior to coming here. Past Medical History  Diagnosis Date  . Hypertension   . Hyperlipidemia   . Insomnia   . Overweight(278.02)   . Vaginal prolapse     with pesary  . S/P cardiac pacemaker procedure November 2011    for tachy/brady syndrome  . Peripheral vascular disease     S/P  femoral bypass  . History of TIA (transient ischemic attack) 2011    TIA episode without an actual TIA   . Bursitis     in joints - hips/neck  . Aortic stenosis, severe   . Ventricular dysfunction     Left mild with EF 45-50%  . CHF (congestive heart failure)   . COPD (chronic obstructive pulmonary disease)   . SVD (spontaneous vaginal delivery)     x 3  . Renal insufficiency     borderline - r/t bp - Dr Mercy Moore at [facility]  . Pacemaker     [employer]  . Shortness of breath     occasionally with exertion  . Heart murmur   . GERD  (gastroesophageal reflux disease)   . Anemia     history  . CAD (coronary artery disease)   . Atrial fibrillation   . Borderline diabetes   . Arthritis    Past Surgical History  Procedure Laterality Date  . Eye surgery       Bilateral Cataracts  . Coronary artery bypass graft  2013    replaced aortic valve  . Pad  2009    vein right arm to left leg   . Wrist surgery  2005    fell broke both wrists and had reconstrictive surgery on both wrists  . Cardiac valve replacement  2013    aorta replacement w/ stent  . Insert / replace / remove pacemaker  2011    [employer]  . Laparoscopic assisted vaginal hysterectomy N/A 01/12/2013    Procedure: LAPAROSCOPIC ASSISTED VAGINAL HYSTERECTOMY;  Surgeon: Maeola Sarah. Landry Mellow, MD;  Location: [facility];  Service: Gynecology;  Laterality: N/A;  . Salpingoophorectomy Bilateral 01/12/2013    Procedure: SALPINGO OOPHORECTOMY;  Surgeon: Maeola Sarah. Landry Mellow, MD;  Location: [facility];  Service: Gynecology;  Laterality: Bilateral;  . Cystocele repair N/A 01/12/2013    Procedure: ANTERIOR REPAIR (CYSTOCELE);  Surgeon: Maeola Sarah. Landry Mellow, MD;  Location: [place] [facility] [facility];  Service: Gynecology;  Laterality: N/A;  . Cystoscopy N/A 01/12/2013    Procedure: CYSTOSCOPY;  Surgeon: Maeola Sarah.  Landry Mellow, MD;  Location: [facility];  Service: Gynecology;  Laterality: N/A;  . Abdominal hysterectomy  01-12-13  . Orif ulnar fracture Left 05/05/2015    Procedure: OPEN REDUCTION INTERNAL FIXATION (ORIF) ULNAR FRACTURE;  Surgeon: Roseanne Kaufman, MD;  Location: [place];  Service: Orthopedics;  Laterality: Left;   Family History  Problem Relation Age of Onset  . Heart disease Father   . Heart attack Father 35  . Diabetes Father   . Cancer Sister     brain  . Heart disease Brother   . Diabetes Daughter   . Hyperlipidemia Son    History  Substance Use Topics  . Smoking status: Former Smoker -- 1.00 packs/day for 60 years    Types: Cigarettes    Quit date: 10/11/2010  . Smokeless tobacco: Never Used  . Alcohol  Use: No   OB History    No data available     Review of Systems  Constitutional: Negative.   HENT: Negative.   Respiratory: Negative.   Cardiovascular: Negative.   Gastrointestinal: Negative.   Musculoskeletal: Positive for arthralgias and gait problem.       Walks with cane or walker prone to frequent falls bilateral wrist pain, right hand pain. Left upper extremity pain. Left upper extremity presently in long arm cast  Skin: Positive for wound.       Old abrasions to both knees, laceration to scalp  Neurological: Negative.   Psychiatric/Behavioral: Negative.   All other systems reviewed and are negative.     Allergies  Procaine hcl; Morphine and related; Ciprofloxacin; Labetalol hcl; and Penicillins  Home Medications   Prior to Admission medications   Medication Sig Start Date End Date Taking? Authorizing Provider  acetaminophen (TYLENOL) 500 MG tablet Take 500 mg by mouth every 6 (six) hours as needed (pain).    Historical Provider, MD  albuterol (PROVENTIL HFA;VENTOLIN HFA) 108 (90 BASE) MCG/ACT inhaler Inhale 1-2 puffs into the lungs See admin instructions. Inhale 1 puff daily at bedtime, may also use once during the day as needed for shortness of breath    Historical Provider, MD  amLODipine (NORVASC) 2.5 MG tablet Take 2.5 mg by mouth at bedtime. 03/23/15   Historical Provider, MD  aspirin EC 81 MG tablet Take 81 mg by mouth at bedtime.    Historical Provider, MD  B Complex-C (SUPER B COMPLEX/VITAMIN C) TABS Take 1 tablet by mouth daily.     Historical Provider, MD  HYDROcodone-acetaminophen (NORCO/VICODIN) 5-325 MG per tablet Take 1-2 tablets by mouth every 4 (four) hours as needed (pain).    Historical Provider, MD  losartan-hydrochlorothiazide (HYZAAR) 100-25 MG per tablet Take 1 tablet by mouth daily.  06/06/13   Historical Provider, MD  metoprolol (LOPRESSOR) 100 MG tablet Take 100 mg by mouth 2 (two) times daily.    Historical Provider, MD  Multiple Vitamin  (MULTIVITAMIN WITH MINERALS) TABS tablet Take 1 tablet by mouth daily. Centrum Silver    Historical Provider, MD  nortriptyline (PAMELOR) 10 MG capsule Take 10 mg by mouth at bedtime.    Historical Provider, MD  omeprazole (PRILOSEC OTC) 20 MG tablet Take 20 mg by mouth daily.    Historical Provider, MD  senna-docusate (SENNA PLUS) 8.6-50 MG per tablet Take 1 tablet by mouth daily as needed for mild constipation.    Historical Provider, MD  simvastatin (ZOCOR) 20 MG tablet Take 20 mg by mouth at bedtime.    Historical Provider, MD  Umeclidinium Bromide (INCRUSE ELLIPTA) 62.5 MCG/INH AEPB  Inhale 1 puff into the lungs daily.    Historical Provider, MD   BP 125/44 mmHg  Pulse 61  Temp(Src) 98.6 F (37 C) (Oral)  Resp 26  Ht 4' 11"  (1.499 m)  Wt 145 lb (65.772 kg)  BMI 29.27 kg/m2  SpO2 100% Physical Exam  Constitutional: She is oriented to person, place, and time.  Chronically ill-appearing  HENT:  3 cm linear laceration at occipital scalp  Eyes: Conjunctivae are normal. Pupils are equal, round, and reactive to light.  Neck: Neck supple. No tracheal deviation present. No thyromegaly present.  Cardiovascular: Normal rate and regular rhythm.   No murmur heard. Pulmonary/Chest: Effort normal and breath sounds normal.  Abdominal: Soft. Bowel sounds are normal. She exhibits no distension. There is no tenderness.  Musculoskeletal: Normal range of motion. She exhibits no edema or tenderness.  [employer] spine nontender pelvis stable nontender. Left upper extremity and long-arm cast. Fingers are ecchymotic. Good capillary refill. Right upper extremity dorsum of hand ecchymotic. Tender at wrist and dorsum of hand. No soft tissue swelling radial pulse 2+. Good capillary refill. Bilateral lower extremities with healing abrasions at knees. No soft tissue swelling or tenderness. Neurovascular intact  Neurological: She is alert and oriented to person, place, and time. No cranial nerve deficit. Coordination  normal.  Moves all extremities well Glasgow Coma Score 15 cranial nerves II through XII grossly intact  Skin: Skin is warm and dry. No rash noted.  Psychiatric: She has a normal mood and affect.  Nursing note and vitals reviewed.   ED Course  Procedures (including critical care time) Labs Review Labs Reviewed - No data to display  Imaging Review No results found.   EKG Interpretation   Date/Time:  Tuesday May 15 2015 12:35:33 EDT Ventricular Rate:  60 PR Interval:  201 QRS Duration: 90 QT Interval:  404 QTC Calculation: 404 R Axis:   1 Text Interpretation:  Sinus rhythm Minimal ST depression, lateral leads  last tracing showed paced rhythm Confirmed by Winfred Leeds  MD, Zakarie Sturdivant [phone])  on 05/15/2015 12:41:44 PM      Results for orders placed or performed during the hospital encounter of 05/15/15  CBC with Differential/Platelet  Result Value Ref Range   WBC 11.4 (H) 4.0 - 10.5 K/uL   RBC 3.78 (L) 3.87 - 5.11 MIL/uL   Hemoglobin 11.6 (L) 12.0 - 15.0 g/dL   HCT 34.0 (L) 36.0 - 46.0 %   MCV 89.9 78.0 - 100.0 fL   MCH 30.7 26.0 - 34.0 pg   MCHC 34.1 30.0 - 36.0 g/dL   RDW 15.9 (H) 11.5 - 15.5 %   Platelets 229 150 - 400 K/uL   Neutrophils Relative % 90 (H) 43 - 77 %   Neutro Abs 9.8 (H) 1.7 - 7.7 K/uL   Lymphocytes Relative 7 (L) 12 - 46 %   Lymphs Abs 0.7 0.7 - 4.0 K/uL   Monocytes Relative 2 (L) 3 - 12 %   Monocytes Absolute 0.3 0.1 - 1.0 K/uL   Eosinophils Relative 1 0 - 5 %   Eosinophils Absolute 0.1 0.0 - 0.7 K/uL   Basophils Relative 0 0 - 1 %   Basophils Absolute 0.0 0.0 - 0.1 K/uL  Urinalysis, Routine w reflex microscopic (not at [facility])  Result Value Ref Range   Color, Urine AMBER (A) YELLOW   APPearance CLEAR CLEAR   Specific Gravity, Urine 1.024 1.005 - 1.030   pH 5.0 5.0 - 8.0   Glucose, UA NEGATIVE NEGATIVE  mg/dL   Hgb urine dipstick NEGATIVE NEGATIVE   Bilirubin Urine SMALL (A) NEGATIVE   Ketones, ur NEGATIVE NEGATIVE mg/dL   Protein, ur NEGATIVE  NEGATIVE mg/dL   Urobilinogen, UA 0.2 0.0 - 1.0 mg/dL   Nitrite NEGATIVE NEGATIVE   Leukocytes, UA NEGATIVE NEGATIVE  I-stat chem 8, ed  Result Value Ref Range   Sodium 130 (L) 135 - 145 mmol/L   Potassium 5.5 (H) 3.5 - 5.1 mmol/L   Chloride 104 101 - 111 mmol/L   BUN 44 (H) 6 - 20 mg/dL   Creatinine, Ser 2.70 (H) 0.44 - 1.00 mg/dL   Glucose, Bld 120 (H) 65 - 99 mg/dL   Calcium, Ion 1.12 (L) 1.13 - 1.30 mmol/L   TCO2 19 0 - 100 mmol/L   Hemoglobin 12.6 12.0 - 15.0 g/dL   HCT 37.0 36.0 - 46.0 %   Dg Chest 2 View  05/05/2015   CLINICAL DATA:  Shortness of breath.  EXAM: CHEST  2 VIEW  COMPARISON:  January 12, 2013.  FINDINGS: Stable cardiomediastinal silhouette. Status post cardiac valve repair. Left sided pacemaker is unchanged in position. No pneumothorax or pleural effusion is noted. No acute pulmonary disease is noted.  IMPRESSION: No active cardiopulmonary disease.   Electronically Signed   By: Marijo Conception, M.D.   On: 05/05/2015 10:41   Dg Forearm Left  05/15/2015   CLINICAL DATA:  Pain status post fall.  EXAM: LEFT FOREARM - 2 VIEW  COMPARISON:  05/03/2015  FINDINGS: There has been an interval plate and screw fixation of the previously demonstrated proximal ulnar fracture, and distal radial fracture. There is some callus formation. Comminution of the olecranon is noted. There is decreased soft tissue swelling. There is no evidence of new acute fracture or dislocation. Cast material obscures detailed evaluation of the soft tissues.  IMPRESSION: Status post plate and screw fixation of proximal ulnar and distal radial fractures, with evidence of minimal healing.  No evidence of new acute fracture or subluxation.   Electronically Signed   By: Fidela Salisbury M.D.   On: 05/15/2015 15:02   Dg Wrist Complete Right  05/15/2015   CLINICAL DATA:  Pain following fall  EXAM: RIGHT WRIST - COMPLETE 3+ VIEW  COMPARISON:  None.  FINDINGS: Frontal, oblique, lateral, and ulnar deviation scaphoid  images were obtained. There is postoperative change in the distal radius with screw and plate fixation in this area. There is chronic avulsion of the ulnar styloid with a bony fragment noted in the triangular fibrocartilage. There is no acute fracture or dislocation. There is moderate narrowing of the radiocarpal joint as well as the scaphotrapezial joint. No erosive change.  IMPRESSION: Evidence of old trauma with remodeling in the distal radius and chronic avulsion of the ulnar styloid. Areas of osteoarthritic change. No acute fracture or dislocation.   Electronically Signed   By: Lowella Grip III M.D.   On: 05/15/2015 14:56   Ct Head Wo Contrast  05/15/2015   CLINICAL DATA:  Patient fell hitting back of head. Several recent syncopal episodes  EXAM: CT HEAD WITHOUT CONTRAST  CT CERVICAL SPINE WITHOUT CONTRAST  TECHNIQUE: Multidetector CT imaging of the head and cervical spine was performed following the standard protocol without intravenous contrast. Multiplanar CT image reconstructions of the cervical spine were also generated.  COMPARISON:  None.  FINDINGS: CT HEAD FINDINGS  There is moderate diffuse atrophy. There is no intracranial mass, hemorrhage, extra-axial fluid collection, or midline shift. There is patchy small  vessel disease in the centra semiovale bilaterally. Elsewhere gray-white compartments appear normal. There is no acute infarct. The bony calvarium appears intact. The mastoid air cells are clear.  CT CERVICAL SPINE FINDINGS  There is no demonstrable fracture. There is slight anterolisthesis of C3 on C4, felt to be due to underlying spondylosis. There is no other appreciable spondylolisthesis. Prevertebral soft tissues and predental space regions are normal. There is moderately severe disc space narrowing at C5-6 and C6-7. There is moderate narrowing at C3-4 and C4-5. There is facet hypertrophy at essentially all levels bilaterally. There is no disc extrusion or stenosis.  There is  calcification in both carotid arteries. There is a focal area of calcification in the right lobe of the thyroid. Visualized lung apices show mild scarring.  IMPRESSION: CT head: Atrophy with periventricular small vessel disease. No intracranial mass, hemorrhage, or acute appearing infarct. No extra-axial fluid collections are identified.  CT cervical spine: No demonstrable fracture. Slight spondylolisthesis at C3-4 is felt to be due to underlying spondylosis. There is multilevel osteoarthritic change. No disc extrusion or stenosis appreciable.  There is carotid artery calcification bilaterally. There appears to be fairly significant narrowing at the sites of calcification, particularly on the left. Duplex carotid ultrasound may be helpful for further assessment in this regard. There is a small calcification in the right lobe of the thyroid of questionable significance.   Electronically Signed   By: Lowella Grip III M.D.   On: 05/15/2015 15:35   Ct Cervical Spine Wo Contrast  05/15/2015   CLINICAL DATA:  Patient fell hitting back of head. Several recent syncopal episodes  EXAM: CT HEAD WITHOUT CONTRAST  CT CERVICAL SPINE WITHOUT CONTRAST  TECHNIQUE: Multidetector CT imaging of the head and cervical spine was performed following the standard protocol without intravenous contrast. Multiplanar CT image reconstructions of the cervical spine were also generated.  COMPARISON:  None.  FINDINGS: CT HEAD FINDINGS  There is moderate diffuse atrophy. There is no intracranial mass, hemorrhage, extra-axial fluid collection, or midline shift. There is patchy small vessel disease in the centra semiovale bilaterally. Elsewhere gray-white compartments appear normal. There is no acute infarct. The bony calvarium appears intact. The mastoid air cells are clear.  CT CERVICAL SPINE FINDINGS  There is no demonstrable fracture. There is slight anterolisthesis of C3 on C4, felt to be due to underlying spondylosis. There is no other  appreciable spondylolisthesis. Prevertebral soft tissues and predental space regions are normal. There is moderately severe disc space narrowing at C5-6 and C6-7. There is moderate narrowing at C3-4 and C4-5. There is facet hypertrophy at essentially all levels bilaterally. There is no disc extrusion or stenosis.  There is calcification in both carotid arteries. There is a focal area of calcification in the right lobe of the thyroid. Visualized lung apices show mild scarring.  IMPRESSION: CT head: Atrophy with periventricular small vessel disease. No intracranial mass, hemorrhage, or acute appearing infarct. No extra-axial fluid collections are identified.  CT cervical spine: No demonstrable fracture. Slight spondylolisthesis at C3-4 is felt to be due to underlying spondylosis. There is multilevel osteoarthritic change. No disc extrusion or stenosis appreciable.  There is carotid artery calcification bilaterally. There appears to be fairly significant narrowing at the sites of calcification, particularly on the left. Duplex carotid ultrasound may be helpful for further assessment in this regard. There is a small calcification in the right lobe of the thyroid of questionable significance.   Electronically Signed   By: Lowella Grip III  M.D.   On: 05/15/2015 15:35   Dg Humerus Left  05/15/2015   CLINICAL DATA:  81 year old female with pain and tenderness in the left upper extremity.  EXAM: LEFT HUMERUS - 2+ VIEW  COMPARISON:  Radiograph 05/03/2015  FINDINGS: Evaluation is limited due to superimposed dressing as well as absence of true lateral projection. There is internal fixation of the previously seen olecranon fracture with plate and screw. There is irregularity of the radial head compatible with previously seen fracture. The radial-capitellar alignment is preserved on the frontal projection. No definite new fracture identified. Evaluation of the distal humerus is limited on the lateral projection.  A left  pectoral pacemaker device is partially visualized.  IMPRESSION: Interval placement of fixation plate and screw through the previously seen olecranon fracture. No new fracture identified.   Electronically Signed   By: Anner Crete M.D.   On: 05/15/2015 15:00   Dg Hand Complete Right  05/15/2015   CLINICAL DATA:  Fall.  Pain and tenderness in the right hand.  EXAM: RIGHT HAND - COMPLETE 3+ VIEW  COMPARISON:  05/15/2015 wrist radiographs  FINDINGS: Osteoarthritis noted with interphalangeal articular space narrowing. Spurring at the interphalangeal joint of the thumb.  Distal radial plate and screw fixator. Old nonunited fracture of the ulnar styloid.  Bony demineralization.  IMPRESSION: 1. 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 No compelling findings of acute bony fracture. 2. Bony demineralization.   Electronically Signed   By: Van Clines M.D.   On: 05/15/2015 14:54    Case management consulted as daughter expresses that home health needs are not adequately met. She is confident that she can care for patient with home health assistance.  4:55 PM pain is controlled after treatment with intravenous fentanyl Scalp laceration repaired by Mr Cathi Roan, PA-c MDM  On further history patient and daughter report that she's been eating and drinking less and sees home from a hospital. Laboratory work consistent with acute kidney injury likely secondary to dehydration.- Patient is current on tetanus immunization   Surgical staple should be removed in one week Spoke with Dr.Samtani plan 23 hour observation telemetry intravenous hydration. Diagnoses #1 fall #2 acute kidney injury #3 hyperkalemia #4hyponatremia #5scalp laceration #6contusions multiple sites Final diagnoses:  None        Orlie Dakin, MD 05/15/15 [phone]

## 2015-05-16 ENCOUNTER — Inpatient Hospital Stay (HOSPITAL_COMMUNITY): Payer: Medicare Other

## 2015-05-16 DIAGNOSIS — W19XXXA Unspecified fall, initial encounter: Secondary | ICD-10-CM | POA: Diagnosis present

## 2015-05-16 DIAGNOSIS — Z950 Presence of cardiac pacemaker: Secondary | ICD-10-CM | POA: Diagnosis not present

## 2015-05-16 DIAGNOSIS — K219 Gastro-esophageal reflux disease without esophagitis: Secondary | ICD-10-CM | POA: Diagnosis present

## 2015-05-16 DIAGNOSIS — E8770 Fluid overload, unspecified: Secondary | ICD-10-CM | POA: Diagnosis not present

## 2015-05-16 DIAGNOSIS — Y92009 Unspecified place in unspecified non-institutional (private) residence as the place of occurrence of the external cause: Secondary | ICD-10-CM | POA: Diagnosis not present

## 2015-05-16 DIAGNOSIS — E871 Hypo-osmolality and hyponatremia: Secondary | ICD-10-CM | POA: Diagnosis present

## 2015-05-16 DIAGNOSIS — Z8673 Personal history of transient ischemic attack (TIA), and cerebral infarction without residual deficits: Secondary | ICD-10-CM | POA: Diagnosis not present

## 2015-05-16 DIAGNOSIS — E878 Other disorders of electrolyte and fluid balance, not elsewhere classified: Secondary | ICD-10-CM | POA: Diagnosis not present

## 2015-05-16 DIAGNOSIS — Z953 Presence of xenogenic heart valve: Secondary | ICD-10-CM | POA: Diagnosis not present

## 2015-05-16 DIAGNOSIS — N183 Chronic kidney disease, stage 3 (moderate): Secondary | ICD-10-CM | POA: Diagnosis present

## 2015-05-16 DIAGNOSIS — I739 Peripheral vascular disease, unspecified: Secondary | ICD-10-CM | POA: Diagnosis present

## 2015-05-16 DIAGNOSIS — E785 Hyperlipidemia, unspecified: Secondary | ICD-10-CM | POA: Diagnosis present

## 2015-05-16 DIAGNOSIS — Z809 Family history of malignant neoplasm, unspecified: Secondary | ICD-10-CM | POA: Diagnosis not present

## 2015-05-16 DIAGNOSIS — N179 Acute kidney failure, unspecified: Secondary | ICD-10-CM | POA: Diagnosis not present

## 2015-05-16 DIAGNOSIS — Z66 Do not resuscitate: Secondary | ICD-10-CM | POA: Diagnosis present

## 2015-05-16 DIAGNOSIS — E875 Hyperkalemia: Secondary | ICD-10-CM | POA: Diagnosis present

## 2015-05-16 DIAGNOSIS — Z833 Family history of diabetes mellitus: Secondary | ICD-10-CM | POA: Diagnosis not present

## 2015-05-16 DIAGNOSIS — I2510 Atherosclerotic heart disease of native coronary artery without angina pectoris: Secondary | ICD-10-CM | POA: Diagnosis present

## 2015-05-16 DIAGNOSIS — E872 Acidosis: Secondary | ICD-10-CM | POA: Diagnosis present

## 2015-05-16 DIAGNOSIS — Z792 Long term (current) use of antibiotics: Secondary | ICD-10-CM | POA: Diagnosis not present

## 2015-05-16 DIAGNOSIS — Z8249 Family history of ischemic heart disease and other diseases of the circulatory system: Secondary | ICD-10-CM | POA: Diagnosis not present

## 2015-05-16 DIAGNOSIS — F0390 Unspecified dementia without behavioral disturbance: Secondary | ICD-10-CM | POA: Diagnosis present

## 2015-05-16 DIAGNOSIS — Z955 Presence of coronary angioplasty implant and graft: Secondary | ICD-10-CM | POA: Diagnosis not present

## 2015-05-16 DIAGNOSIS — Z87891 Personal history of nicotine dependence: Secondary | ICD-10-CM | POA: Diagnosis not present

## 2015-05-16 DIAGNOSIS — Z79899 Other long term (current) drug therapy: Secondary | ICD-10-CM | POA: Diagnosis not present

## 2015-05-16 DIAGNOSIS — I6529 Occlusion and stenosis of unspecified carotid artery: Secondary | ICD-10-CM | POA: Diagnosis present

## 2015-05-16 DIAGNOSIS — E663 Overweight: Secondary | ICD-10-CM | POA: Diagnosis present

## 2015-05-16 DIAGNOSIS — R410 Disorientation, unspecified: Secondary | ICD-10-CM | POA: Diagnosis present

## 2015-05-16 DIAGNOSIS — E860 Dehydration: Secondary | ICD-10-CM | POA: Diagnosis present

## 2015-05-16 DIAGNOSIS — Z951 Presence of aortocoronary bypass graft: Secondary | ICD-10-CM | POA: Diagnosis not present

## 2015-05-16 DIAGNOSIS — J449 Chronic obstructive pulmonary disease, unspecified: Secondary | ICD-10-CM | POA: Diagnosis present

## 2015-05-16 DIAGNOSIS — R296 Repeated falls: Secondary | ICD-10-CM | POA: Diagnosis present

## 2015-05-16 DIAGNOSIS — Z7982 Long term (current) use of aspirin: Secondary | ICD-10-CM | POA: Diagnosis not present

## 2015-05-16 DIAGNOSIS — M25531 Pain in right wrist: Secondary | ICD-10-CM | POA: Diagnosis present

## 2015-05-16 DIAGNOSIS — D509 Iron deficiency anemia, unspecified: Secondary | ICD-10-CM | POA: Diagnosis present

## 2015-05-16 DIAGNOSIS — S0101XA Laceration without foreign body of scalp, initial encounter: Secondary | ICD-10-CM | POA: Diagnosis present

## 2015-05-16 DIAGNOSIS — R5381 Other malaise: Secondary | ICD-10-CM | POA: Diagnosis present

## 2015-05-16 DIAGNOSIS — Z79891 Long term (current) use of opiate analgesic: Secondary | ICD-10-CM | POA: Diagnosis not present

## 2015-05-16 DIAGNOSIS — Z6832 Body mass index (BMI) 32.0-32.9, adult: Secondary | ICD-10-CM | POA: Diagnosis not present

## 2015-05-16 DIAGNOSIS — I129 Hypertensive chronic kidney disease with stage 1 through stage 4 chronic kidney disease, or unspecified chronic kidney disease: Secondary | ICD-10-CM | POA: Diagnosis present

## 2015-05-16 LAB — COMPREHENSIVE METABOLIC PANEL
ALK PHOS: 61 U/L (ref 38–126)
ALT: 46 U/L (ref 14–54)
ANION GAP: 11 (ref 5–15)
AST: 42 U/L — ABNORMAL HIGH (ref 15–41)
Albumin: 2.8 g/dL — ABNORMAL LOW (ref 3.5–5.0)
BILIRUBIN TOTAL: 0.3 mg/dL (ref 0.3–1.2)
BUN: 41 mg/dL — AB (ref 6–20)
CHLORIDE: 105 mmol/L (ref 101–111)
CO2: 16 mmol/L — AB (ref 22–32)
Calcium: 8.3 mg/dL — ABNORMAL LOW (ref 8.9–10.3)
Creatinine, Ser: 2.78 mg/dL — ABNORMAL HIGH (ref 0.44–1.00)
GFR calc non Af Amer: 15 mL/min — ABNORMAL LOW (ref >60)
GFR, EST AFRICAN AMERICAN: 17 mL/min — AB (ref >60)
GLUCOSE: 117 mg/dL — AB (ref 65–99)
POTASSIUM: 4.7 mmol/L (ref 3.5–5.1)
SODIUM: 132 mmol/L — AB (ref 135–145)
Total Protein: 5.7 g/dL — ABNORMAL LOW (ref 6.5–8.1)

## 2015-05-16 LAB — CBC
HCT: 29.8 % — ABNORMAL LOW (ref 36.0–46.0)
Hemoglobin: 10.1 g/dL — ABNORMAL LOW (ref 12.0–15.0)
MCH: 31.3 pg (ref 26.0–34.0)
MCHC: 33.9 g/dL (ref 30.0–36.0)
MCV: 92.3 fL (ref 78.0–100.0)
PLATELETS: 172 10*3/uL (ref 150–400)
RBC: 3.23 MIL/uL — ABNORMAL LOW (ref 3.87–5.11)
RDW: 16.1 % — ABNORMAL HIGH (ref 11.5–15.5)
WBC: 8.9 10*3/uL (ref 4.0–10.5)

## 2015-05-16 LAB — OSMOLALITY: OSMOLALITY: 301 mosm/kg — AB (ref 275–300)

## 2015-05-16 LAB — PROTIME-INR
INR: 1.27 (ref 0.00–1.49)
PROTHROMBIN TIME: 16 s — AB (ref 11.6–15.2)

## 2015-05-16 LAB — GLUCOSE, CAPILLARY: Glucose-Capillary: 163 mg/dL — ABNORMAL HIGH (ref 65–99)

## 2015-05-16 MED ORDER — HALOPERIDOL LACTATE 5 MG/ML IJ SOLN
2.0000 mg | Freq: Four times a day (QID) | INTRAMUSCULAR | Status: DC | PRN
  Administered 2015-05-16 – 2015-05-21 (×4): 2 mg via INTRAVENOUS
  Filled 2015-05-16 (×5): qty 1

## 2015-05-16 MED ORDER — SODIUM CHLORIDE 0.9 % IV SOLN: INTRAVENOUS | Status: DC

## 2015-05-16 NOTE — Progress Notes (Addendum)
Patient Demographics:    Chelsey Campbell, is a 81 y.o. female, DOB - 11/13/1928, JTT:[id number]  Admit date - 05/15/2015   Admitting Physician Nita Sells, MD  Outpatient Primary MD for the patient is [name], MD  LOS -    Chief Complaint  Patient presents with  . Fall        Subjective:    Chelsey Campbell today has, No headache, No chest pain, No abdominal pain - No Nausea, No new weakness tingling or numbness, No Cough - SOB.     Assessment  & Plan :     1. ARF on CKD 3 - baseline creat 1.7, check urine electrolytes, post what bladder scan, continue gentle hydration and avoid nephrotoxicity and monitor BMP. Per daughter BMP was checked a few months ago by PCP and creatinine was again close to 1.7.   2. Hyperkalemia due to #1 above resolved.   3. Multiple falls. PT eval will require placement. She has pacemaker placed.   4. PAD. Continue 81 mg aspirin.   5. Moderate dementia. Outpatient follow with PCP and if needed neurology.   6. GERD. On PPI.   7. Dyslipidemia. Continue home dose statin   8. Recent left olecranon and radial fracture from fall. Follow with Dr. Amedeo Plenty and post discharge. Arm under cast.   9. Incidental finding of carotid artery stenosis and questionable right-sided thyroid calcification. Outpatient age-appropriate followed by PCP. Does not appear to be a candidate for carotid artery endarterectomy etc. On aspirin and statin continue for secondary prevention.   10. H/O [employer] pacemaker - tele stable, due to falls requested Cards to interrogate.    Code Status : Full  Family Communication  : daughter  Disposition Plan  : TBD  Consults  :    Procedures  : CT head and C spine  DVT Prophylaxis  :   Heparin    Lab Results    Component Value Date   PLT 172 05/16/2015    Inpatient Medications  Scheduled Meds: . amLODipine  2.5 mg Oral QHS  . aspirin EC  81 mg Oral QHS  . heparin  5,000 Units Subcutaneous 3 times per day  . metoprolol  100 mg Oral BID  . nortriptyline  10 mg Oral QHS  . sodium chloride  3 mL Intravenous Q12H  . Umeclidinium Bromide  1 puff Inhalation Daily   Continuous Infusions: . sodium chloride 75 mL/hr at 05/16/15 0801   PRN Meds:.albuterol, HYDROcodone-acetaminophen  Antibiotics  :     Anti-infectives    None        Objective:   Filed Vitals:   05/15/15 1745 05/15/15 2135 05/16/15 0547 05/16/15 0937  BP: 111/75 136/101 124/42 116/52  Pulse: 60 64 60 59  Temp: 98.9 F (37.2 C) 98.5 F (36.9 C) 98.7 F (37.1 C) 98.4 F (36.9 C)  TempSrc: Oral Oral Oral Oral  Resp: 17 16 18 17   Height:      Weight:      SpO2: 100% 97% 92% 100%    Wt Readings from Last 3 Encounters:  05/15/15 65.772 kg (145 lb)  05/05/15 65.772 kg (145 lb)  04/17/14 66.225 kg (146 lb)     Intake/Output Summary (Last 24 hours)  at 05/16/15 1202 Last data filed at 05/16/15 0847  Gross per 24 hour  Intake   2685 ml  Output      0 ml  Net   2685 ml     Physical Exam  Awake , Oriented X 1, No new F.N deficits, Normal affect [place].AT,PERRAL Supple Neck,No JVD, No cervical lymphadenopathy appriciated.  Symmetrical Chest wall movement, Good air movement bilaterally, CTAB RRR,No Gallops,Rubs or new Murmurs, No Parasternal Heave +ve B.Sounds, Abd Soft, No tenderness, No organomegaly appriciated, No rebound - guarding or rigidity. No Cyanosis, Clubbing or edema, No new Rash or bruise   Left arm in splint    Data Review:   Micro Results No results found for this or any previous visit (from the past 240 hour(s)).  Radiology Reports Dg Chest 2 View  05/05/2015   CLINICAL DATA:  Shortness of breath.  EXAM: CHEST  2 VIEW  COMPARISON:  January 12, 2013.  FINDINGS: Stable cardiomediastinal  silhouette. Status post cardiac valve repair. Left sided pacemaker is unchanged in position. No pneumothorax or pleural effusion is noted. No acute pulmonary disease is noted.  IMPRESSION: No active cardiopulmonary disease.   Electronically Signed   By: Marijo Conception, M.D.   On: 05/05/2015 10:41   Dg Forearm Left  05/15/2015   CLINICAL DATA:  Pain status post fall.  EXAM: LEFT FOREARM - 2 VIEW  COMPARISON:  05/03/2015  FINDINGS: There has been an interval plate and screw fixation of the previously demonstrated proximal ulnar fracture, and distal radial fracture. There is some callus formation. Comminution of the olecranon is noted. There is decreased soft tissue swelling. There is no evidence of new acute fracture or dislocation. Cast material obscures detailed evaluation of the soft tissues.  IMPRESSION: Status post plate and screw fixation of proximal ulnar and distal radial fractures, with evidence of minimal healing.  No evidence of new acute fracture or subluxation.   Electronically Signed   By: Fidela Salisbury M.D.   On: 05/15/2015 15:02   Dg Wrist Complete Right  05/15/2015   CLINICAL DATA:  Pain following fall  EXAM: RIGHT WRIST - COMPLETE 3+ VIEW  COMPARISON:  None.  FINDINGS: Frontal, oblique, lateral, and ulnar deviation scaphoid images were obtained. There is postoperative change in the distal radius with screw and plate fixation in this area. There is chronic avulsion of the ulnar styloid with a bony fragment noted in the triangular fibrocartilage. There is no acute fracture or dislocation. There is moderate narrowing of the radiocarpal joint as well as the scaphotrapezial joint. No erosive change.  IMPRESSION: Evidence of old trauma with remodeling in the distal radius and chronic avulsion of the ulnar styloid. Areas of osteoarthritic change. No acute fracture or dislocation.   Electronically Signed   By: Lowella Grip III M.D.   On: 05/15/2015 14:56   Ct Head Wo Contrast  05/15/2015    CLINICAL DATA:  Patient fell hitting back of head. Several recent syncopal episodes  EXAM: CT HEAD WITHOUT CONTRAST  CT CERVICAL SPINE WITHOUT CONTRAST  TECHNIQUE: Multidetector CT imaging of the head and cervical spine was performed following the standard protocol without intravenous contrast. Multiplanar CT image reconstructions of the cervical spine were also generated.  COMPARISON:  None.  FINDINGS: CT HEAD FINDINGS  There is moderate diffuse atrophy. There is no intracranial mass, hemorrhage, extra-axial fluid collection, or midline shift. There is patchy small vessel disease in the centra semiovale bilaterally. Elsewhere gray-white compartments appear normal. There is  no acute infarct. The bony calvarium appears intact. The mastoid air cells are clear.  CT CERVICAL SPINE FINDINGS  There is no demonstrable fracture. There is slight anterolisthesis of C3 on C4, felt to be due to underlying spondylosis. There is no other appreciable spondylolisthesis. Prevertebral soft tissues and predental space regions are normal. There is moderately severe disc space narrowing at C5-6 and C6-7. There is moderate narrowing at C3-4 and C4-5. There is facet hypertrophy at essentially all levels bilaterally. There is no disc extrusion or stenosis.  There is calcification in both carotid arteries. There is a focal area of calcification in the right lobe of the thyroid. Visualized lung apices show mild scarring.  IMPRESSION: CT head: Atrophy with periventricular small vessel disease. No intracranial mass, hemorrhage, or acute appearing infarct. No extra-axial fluid collections are identified.  CT cervical spine: No demonstrable fracture. Slight spondylolisthesis at C3-4 is felt to be due to underlying spondylosis. There is multilevel osteoarthritic change. No disc extrusion or stenosis appreciable.  There is carotid artery calcification bilaterally. There appears to be fairly significant narrowing at the sites of calcification,  particularly on the left. Duplex carotid ultrasound may be helpful for further assessment in this regard. There is a small calcification in the right lobe of the thyroid of questionable significance.   Electronically Signed   By: Lowella Grip III M.D.   On: 05/15/2015 15:35   Ct Cervical Spine Wo Contrast  05/15/2015   CLINICAL DATA:  Patient fell hitting back of head. Several recent syncopal episodes  EXAM: CT HEAD WITHOUT CONTRAST  CT CERVICAL SPINE WITHOUT CONTRAST  TECHNIQUE: Multidetector CT imaging of the head and cervical spine was performed following the standard protocol without intravenous contrast. Multiplanar CT image reconstructions of the cervical spine were also generated.  COMPARISON:  None.  FINDINGS: CT HEAD FINDINGS  There is moderate diffuse atrophy. There is no intracranial mass, hemorrhage, extra-axial fluid collection, or midline shift. There is patchy small vessel disease in the centra semiovale bilaterally. Elsewhere gray-white compartments appear normal. There is no acute infarct. The bony calvarium appears intact. The mastoid air cells are clear.  CT CERVICAL SPINE FINDINGS  There is no demonstrable fracture. There is slight anterolisthesis of C3 on C4, felt to be due to underlying spondylosis. There is no other appreciable spondylolisthesis. Prevertebral soft tissues and predental space regions are normal. There is moderately severe disc space narrowing at C5-6 and C6-7. There is moderate narrowing at C3-4 and C4-5. There is facet hypertrophy at essentially all levels bilaterally. There is no disc extrusion or stenosis.  There is calcification in both carotid arteries. There is a focal area of calcification in the right lobe of the thyroid. Visualized lung apices show mild scarring.  IMPRESSION: CT head: Atrophy with periventricular small vessel disease. No intracranial mass, hemorrhage, or acute appearing infarct. No extra-axial fluid collections are identified.  CT cervical  spine: No demonstrable fracture. Slight spondylolisthesis at C3-4 is felt to be due to underlying spondylosis. There is multilevel osteoarthritic change. No disc extrusion or stenosis appreciable.  There is carotid artery calcification bilaterally. There appears to be fairly significant narrowing at the sites of calcification, particularly on the left. Duplex carotid ultrasound may be helpful for further assessment in this regard. There is a small calcification in the right lobe of the thyroid of questionable significance.   Electronically Signed   By: Lowella Grip III M.D.   On: 05/15/2015 15:35   Dg Humerus Left  05/15/2015  CLINICAL DATA:  81 year old female with pain and tenderness in the left upper extremity.  EXAM: LEFT HUMERUS - 2+ VIEW  COMPARISON:  Radiograph 05/03/2015  FINDINGS: Evaluation is limited due to superimposed dressing as well as absence of true lateral projection. There is internal fixation of the previously seen olecranon fracture with plate and screw. There is irregularity of the radial head compatible with previously seen fracture. The radial-capitellar alignment is preserved on the frontal projection. No definite new fracture identified. Evaluation of the distal humerus is limited on the lateral projection.  A left pectoral pacemaker device is partially visualized.  IMPRESSION: Interval placement of fixation plate and screw through the previously seen olecranon fracture. No new fracture identified.   Electronically Signed   By: Anner Crete M.D.   On: 05/15/2015 15:00   Dg Hand Complete Right  05/15/2015   CLINICAL DATA:  Fall.  Pain and tenderness in the right hand.  EXAM: RIGHT HAND - COMPLETE 3+ VIEW  COMPARISON:  05/15/2015 wrist radiographs  FINDINGS: Osteoarthritis noted with interphalangeal articular space narrowing. Spurring at the interphalangeal joint of the thumb.  Distal radial plate and screw fixator. Old nonunited fracture of the ulnar styloid.  Bony  demineralization.  IMPRESSION: 1. 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 No compelling findings of acute bony fracture. 2. Bony demineralization.   Electronically Signed   By: Van Clines M.D.   On: 05/15/2015 14:54     CBC  Recent Labs Lab 05/15/15 1315 05/15/15 1450 05/15/15 1907 05/16/15 0525  WBC 11.4*  --  9.5 8.9  HGB 11.6* 12.6 10.3* 10.1*  HCT 34.0* 37.0 30.8* 29.8*  PLT 229  --  193 172  MCV 89.9  --  89.8 92.3  MCH 30.7  --  30.0 31.3  MCHC 34.1  --  33.4 33.9  RDW 15.9*  --  15.8* 16.1*  LYMPHSABS 0.7  --   --   --   MONOABS 0.3  --   --   --   EOSABS 0.1  --   --   --   BASOSABS 0.0  --   --   --     Chemistries   Recent Labs Lab 05/15/15 1450 05/15/15 1907 05/16/15 0525  NA 130*  --  132*  K 5.5*  --  4.7  CL 104  --  105  CO2  --   --  16*  GLUCOSE 120*  --  117*  BUN 44*  --  41*  CREATININE 2.70* 2.54* 2.78*  CALCIUM  --   --  8.3*  AST  --   --  42*  ALT  --   --  46  ALKPHOS  --   --  61  BILITOT  --   --  0.3   ------------------------------------------------------------------------------------------------------------------ estimated creatinine clearance is 12.4 mL/min (by C-G formula based on Cr of 2.78). ------------------------------------------------------------------------------------------------------------------ No results for input(s): HGBA1C in the last 72 hours. ------------------------------------------------------------------------------------------------------------------ No results for input(s): CHOL, HDL, LDLCALC, TRIG, CHOLHDL, LDLDIRECT in the last 72 hours. ------------------------------------------------------------------------------------------------------------------ No results for input(s): TSH, T4TOTAL, T3FREE, THYROIDAB in the last 72 hours.  Invalid input(s): FREET3 ------------------------------------------------------------------------------------------------------------------ No results for input(s): VITAMINB12,  FOLATE, FERRITIN, TIBC, IRON, RETICCTPCT in the last 72 hours.  Coagulation profile  Recent Labs Lab 05/16/15 0525  INR 1.27    No results for input(s): DDIMER in the last 72 hours.  Cardiac Enzymes No results for input(s): CKMB, TROPONINI, MYOGLOBIN in the last 168 hours.  Invalid input(s): CK ------------------------------------------------------------------------------------------------------------------  Invalid input(s): POCBNP   Time Spent in minutes   35   SINGH,PRASHANT K M.D on 05/16/2015 at 12:02 PM  Between 7am to 7pm - Pager - [phone]  After 7pm go to [email or web address] - password [email or web address]  [facility]   Office  [phone]    '

## 2015-05-16 NOTE — Significant Event (Signed)
Rapid Response Event Note  Overview: Time Called: 1351 Arrival Time: 1354 Event Type: Respiratory  Initial Focused Assessment:  Called by RN for patient noted to be slumped down in bed and was dusky, pale looking.  On my arrival to patients room, RN at bedside and lab obtaining blood.  Patient sitting in bed on nasal cannula, as per RN was increased to 5LPm when patietn noted to be dusky.  Patient is pleasantly confused looks a little pale.  Patient states she is having some SOB.    Interventions:  Patients knees ore mottled appearing, left arm in ace wrapped bandage.  126/55, 59, 100% on 2 lpm, rr 18.  Md paged and updated.  Breath Sounds clear and diminished.  PRN breathing treatment administered.     Event Summary:  No RRT intervention at this time.  RN to call if assistance needed   at      at          [facility], [place]

## 2015-05-16 NOTE — Discharge Instructions (Signed)
Follow with Primary MD [name], MD in 7 days , follow your CT results for Carotid and Thyroid findings  Get CBC, CMP, 2 view Chest X ray checked  by Primary MD next visit.    Activity: As tolerated with Full fall precautions use walker/cane & assistance as needed   Disposition SNF   Diet: Heart Healthy  with feeding assistance and aspiration precautions.  For Heart failure patients - Check your Weight same time everyday, if you gain over 2 pounds, or you develop in leg swelling, experience more shortness of breath or chest pain, call your Primary MD immediately. Follow Cardiac Low Salt Diet and 1.5 lit/day fluid restriction.   On your next visit with your primary care physician please Get Medicines reviewed and adjusted.   Please request your Prim.MD to go over all Hospital Tests and Procedure/Radiological results at the follow up, please get all Hospital records sent to your Prim MD by signing hospital release before you go home.   If you experience worsening of your admission symptoms, develop shortness of breath, life threatening emergency, suicidal or homicidal thoughts you must seek medical attention immediately by calling 911 or calling your MD immediately  if symptoms less severe.  You Must read complete instructions/literature along with all the possible adverse reactions/side effects for all the Medicines you take and that have been prescribed to you. Take any new Medicines after you have completely understood and accpet all the possible adverse reactions/side effects.   Do not drive, operating heavy machinery, perform activities at heights, swimming or participation in water activities or provide baby sitting services if your were admitted for syncope or siezures until you have seen by Primary MD or a Neurologist and advised to do so again.  Do not drive when taking Pain medications.    Do not take more than prescribed Pain, Sleep and Anxiety Medications  Special  Instructions: If you have smoked or chewed Tobacco  in the last 2 yrs please stop smoking, stop any regular Alcohol  and or any Recreational drug use.  Wear Seat belts while driving.   Please note  You were cared for by a hospitalist during your hospital stay. If you have any questions about your discharge medications or the care you received while you were in the hospital after you are discharged, you can call the unit and asked to speak with the hospitalist on call if the hospitalist that took care of you is not available. Once you are discharged, your primary care physician will handle any further medical issues. Please note that NO REFILLS for any discharge medications will be authorized once you are discharged, as it is imperative that you return to your primary care physician (or establish a relationship with a primary care physician if you do not have one) for your aftercare needs so that they can reassess your need for medications and monitor your lab values.

## 2015-05-16 NOTE — Consult Note (Signed)
  Chelsey Campbell is seen at bedside Family is present  She is awake alert and oriented for the most part. I have removed her bandage. Her arm is stable I removed 50% of her sutures cleanse the wound and note no signs of infection. I redressed her with Xeroform and gauze followed by a new long-arm cast. I reviewed the x-rays with the family at bedside. I do feel that she's had some migration of her very tip of the ulna fracture in terms of its release against the triceps. I discussed with the family I would not recommend further surgery but would allow this to stay as he is. They certainly concur. The remaining aspects of her hardware look very good.  We'll continue close observation.  I will be monitoring her while she is in the hospital of course.  The remaining sutures will need to come out next week and I want to give the wound extra time to heal  I discussed this with the hospitalist.  The arm is stable and neurovascularly intact without signs of infection or compartment syndrome  Kaio Kuhlman M.D.

## 2015-05-16 NOTE — Progress Notes (Signed)
Called by Nursing Instructor that pt appeared to need help. Upon walking in pt's room noted pt was slid down in bed with feet hanging over footboard and pt's head was slumped over; pt's face was blue and dusky in color. Sat pt up in bed and increased oxygen flow to 5 LPm; O2 saturation 100%. Pt's color returned to normal. Pt still slightly confused but this has been pt's baseline since her daughter left at apporx. 10:00. Pt c/o SOB; PRN breathing treatment given. MD made aware of occurrence.

## 2015-05-16 NOTE — Evaluation (Signed)
Physical Therapy Evaluation Patient Details Name: Chelsey Campbell MRN: [id number] DOB: 03-Jul-1928 Today's Date: 05/16/2015   History of Present Illness  Pt is a 81 y.o.F s/p ORIF left ulnar fracture.Pt's PMH includes HTN, PVD, TIA, CHF, COPD, pacemaker, anemia, arthritis, and wrist surgery.  Admitted 6/28 again with fall.  AKI as well.    Clinical Impression  Pt admitted with above diagnosis. Pt currently with functional limitations due to the deficits listed below (see PT Problem List). Pt requiring total assist with bed mobility and unable to sit EOB.  Will benefit from PT to continue to work on mobility but needs a SNF as daughter/family struggling to care for pt at home.  Will follow acutely.  Pt will benefit from skilled PT to increase their independence and safety with mobility to allow discharge to the venue listed below.      Follow Up Recommendations SNF;Supervision/Assistance - 24 hour    Equipment Recommendations  None recommended by PT    Recommendations for Other Services       Precautions / Restrictions Precautions Precautions: Fall Precaution Comments: no pushing, pulling, lifting with LUE (can use to hold light items) Restrictions Weight Bearing Restrictions: Yes LUE Weight Bearing: Non weight bearing      Mobility  Bed Mobility Overal bed mobility: Needs Assistance Bed Mobility: Supine to Sit     Supine to sit: Total assist Sit to supine: Total assist   General bed mobility comments: Pt unable to get to EOB with one person assist  Assited back to supine and used bed to slide pt up in bed.  Noted pt incontinent.  Called NT who was to come in and change pt after X-ray completed as X-ray came in the meantime.    Transfers                    Ambulation/Gait                Stairs            Wheelchair Mobility    Modified Rankin (Stroke Patients Only)       Balance Overall balance assessment: Needs assistance;History of  Falls Sitting-balance support: Single extremity supported;Feet supported Sitting balance-Leahy Scale: Zero Sitting balance - Comments: Pt unable to sit EOB.                                      Pertinent Vitals/Pain Pain Assessment: Faces Faces Pain Scale: Hurts little more Pain Location: left UE Pain Intervention(s): Limited activity within patient's tolerance;Monitored during session;Repositioned  VSS    Home Living Family/patient expects to be discharged to:: Private residence Living Arrangements: Children Available Help at Discharge: Family;Available 24 hours/day Type of Home: House Home Access: Stairs to enter Entrance Stairs-Rails: Right;Left;Can reach both Entrance Stairs-Number of Steps: 3 Home Layout: One level Home Equipment: Bedside commode;Cane - single point;Walker - 2 wheels      Prior Function Level of Independence: Independent with assistive device(s)         Comments: uses cane at all times     Hand Dominance   Dominant Hand: Right    Extremity/Trunk Assessment   Upper Extremity Assessment: Defer to OT evaluation           Lower Extremity Assessment: Generalized weakness         Communication   Communication: No difficulties  Cognition Arousal/Alertness: Awake/alert Behavior During  Therapy: WFL for tasks assessed/performed Overall Cognitive Status: History of cognitive impairments - at baseline Area of Impairment: Orientation;Problem solving Orientation Level: Disoriented to;Place   Memory: Decreased short-term memory       Problem Solving: Slow processing;Requires verbal cues      General Comments      Exercises        Assessment/Plan    PT Assessment Patient needs continued PT services  PT Diagnosis Generalized weakness;Acute pain   PT Problem List Decreased strength;Decreased activity tolerance;Decreased balance;Decreased mobility;Decreased knowledge of use of DME;Decreased safety awareness;Decreased  knowledge of precautions;Pain  PT Treatment Interventions DME instruction;Gait training;Stair training;Functional mobility training;Therapeutic activities;Therapeutic exercise;Balance training;Neuromuscular re-education;Cognitive remediation;Patient/family education;Modalities   PT Goals (Current goals can be found in the Care Plan section) Acute Rehab PT Goals Patient Stated Goal: unable to state as pt confused PT Goal Formulation: With patient Time For Goal Achievement: 05/30/15 Potential to Achieve Goals: Good    Frequency Min 2X/week   Barriers to discharge Inaccessible home environment 3 steps to enter home    Co-evaluation               End of Session Equipment Utilized During Treatment: Gait belt Activity Tolerance: Patient limited by fatigue;Patient limited by pain Patient left: in bed;with call bell/phone within reach;with bed alarm set;with nursing/sitter in room Nurse Communication: Mobility status;Need for lift equipment         Time: 9030-0923 PT Time Calculation (min) (ACUTE ONLY): 9 min   Charges:   PT Evaluation $Initial PT Evaluation Tier I: 1 Procedure     PT G CodesDenice Paradise 06-06-2015, 4:11 PM  [facility] Acute Rehabilitation [phone] (pager)

## 2015-05-17 ENCOUNTER — Inpatient Hospital Stay (HOSPITAL_COMMUNITY): Payer: Medicare Other

## 2015-05-17 LAB — IRON AND TIBC
Iron: 19 ug/dL — ABNORMAL LOW (ref 28–170)
Saturation Ratios: 9 % — ABNORMAL LOW (ref 10.4–31.8)
TIBC: 200 ug/dL — ABNORMAL LOW (ref 250–450)
UIBC: 181 ug/dL

## 2015-05-17 LAB — BASIC METABOLIC PANEL
ANION GAP: 6 (ref 5–15)
BUN: 51 mg/dL — AB (ref 6–20)
CALCIUM: 8.4 mg/dL — AB (ref 8.9–10.3)
CO2: 17 mmol/L — ABNORMAL LOW (ref 22–32)
Chloride: 110 mmol/L (ref 101–111)
Creatinine, Ser: 2.93 mg/dL — ABNORMAL HIGH (ref 0.44–1.00)
GFR calc Af Amer: 16 mL/min — ABNORMAL LOW (ref >60)
GFR, EST NON AFRICAN AMERICAN: 14 mL/min — AB (ref >60)
Glucose, Bld: 89 mg/dL (ref 65–99)
POTASSIUM: 4.7 mmol/L (ref 3.5–5.1)
SODIUM: 133 mmol/L — AB (ref 135–145)

## 2015-05-17 LAB — CK: Total CK: 31 U/L — ABNORMAL LOW (ref 38–234)

## 2015-05-17 LAB — TSH: TSH: 2.527 u[IU]/mL (ref 0.350–4.500)

## 2015-05-17 LAB — FERRITIN: Ferritin: 913 ng/mL — ABNORMAL HIGH (ref 11–307)

## 2015-05-17 MED ORDER — SODIUM CHLORIDE 0.9 % IV SOLN
INTRAVENOUS | Status: DC
Start: 2015-05-17 — End: 2015-05-18
  Administered 2015-05-17: 12:00:00 via INTRAVENOUS
  Administered 2015-05-18: 75 mL via INTRAVENOUS

## 2015-05-17 MED ORDER — SENNOSIDES-DOCUSATE SODIUM 8.6-50 MG PO TABS
1.0000 | ORAL_TABLET | Freq: Every day | ORAL | Status: DC
  Administered 2015-05-17 – 2015-05-21 (×5): 1 via ORAL
  Filled 2015-05-17 (×5): qty 1

## 2015-05-17 MED ORDER — SODIUM BICARBONATE 650 MG PO TABS
650.0000 mg | ORAL_TABLET | Freq: Two times a day (BID) | ORAL | Status: DC
  Administered 2015-05-17 (×2): 650 mg via ORAL
  Filled 2015-05-17 (×3): qty 1

## 2015-05-17 NOTE — Clinical Social Work Note (Signed)
Clinical Social Work Assessment  Patient Details  Name: Chelsey Campbell MRN: [id number] Date of Birth: 02/12/1928  Date of referral:  05/16/15               Reason for consult:  Facility Placement                Permission sought to share information with:  Family Supports Permission granted to share information::  Yes, Release of Information Signed  Name::     [name], Ron H[place], [place]::     Relationship::  Daughters and son-in-law  Sport and exercise psychologist Information:  [phone] - daughter Cherie  Housing/Transportation Living arrangements for the past 2 months:  [place] of Information:  Patient, Adult Children Patient Interpreter Needed:  None Criminal Activity/Legal Involvement Pertinent to Current Situation/Hospitalization:  No - Comment as needed Significant Relationships:  Adult Children, Other Family Members Lives with:  Adult Children ([place] and Buzzy Han) Do you feel safe going back to the place where you live?  Yes Need for family participation in patient care:  Yes (Comment)  Care giving concerns:  Patient and family acknowledge that patient is not safe at home at this time, even though she lives with family   Social Worker assessment / plan:  CSW talked with patient who was alert, oriented and open to talking with CSW. Daughter Cherie and great-granddaughter [place], were at the bedside. Patient actively involved in conversation about rehab, her family and her health history- past and current. [name] lives with her daughter Antony Salmon and has her own living space and entrance at their home. She also spoke fondly about her dog who is very important to her. Patient and daughter in agreement with ST rehab and facility preference is [facility] as they live very near facility. CSW explained facility search process and provided daughter with skilled facility list for [facility].   Employment status:  Disabled (Comment on whether or not  currently receiving Disability) Insurance information:  Medicare, Medicaid In [place] PT Recommendations:  [place] / Referral to community resources:  Other (Comment Required) (None needed or requested at this time)  Patient/Family's Response to care:  Patient/family expressed no concerns regarding current level of care.  Patient/Family's Understanding of and Emotional Response to Diagnosis, Current Treatment, and Prognosis: Patient and daughter very knowledgeable about patient's health and issues that brought her into the hospital this admission.  Emotional Assessment Appearance:  Appears stated age Attitude/Demeanor/Rapport:  Other (Appropriate) Affect (typically observed):  Accepting, Appropriate, Pleasant Orientation:  Oriented to Self, Oriented to Place, Oriented to  Time, Oriented to Situation Alcohol / Substance use:  Never Used Psych involvement (Current and /or in the community):  No (Comment)  Discharge Needs  Concerns to be addressed:  Discharge Planning Concerns (Patient needs ST rehab before returing home) Readmission within the last 30 days:  Yes Current discharge risk:  None Barriers to Discharge:  No Barriers Identified   Sable Feil, LCSW 05/17/2015, 3:22 PM

## 2015-05-17 NOTE — Clinical Social Work Placement (Signed)
   CLINICAL SOCIAL WORK PLACEMENT  NOTE  Date:  05/17/2015  Patient Details  Name: Chelsey Campbell MRN: [id number] Date of Birth: 1928-04-17  Clinical Social Work is seeking post-discharge placement for this patient at the [place] level of care (*CSW will initial, date and re-position this form in  chart as items are completed):  Yes   Patient/family provided with [place] Work Department's list of facilities offering this level of care within the geographic area requested by the patient (or if unable, by the patient's family).  Yes   Patient/family informed of their freedom to choose among providers that offer the needed level of care, that participate in Medicare, Medicaid or managed care program needed by the patient, have an available bed and are willing to accept the patient.  Yes   Patient/family informed of [facility]'s ownership interest in [facility] and [facility], as well as of the fact that they are under no obligation to receive care at these facilities.  PASRR submitted to EDS on 05/17/15     PASRR number received on 05/17/15     Existing PASRR number confirmed on       FL2 transmitted to all facilities in geographic area requested by pt/family on 05/17/15     FL2 transmitted to all facilities within larger geographic area on       Patient informed that his/her managed care company has contracts with or will negotiate with certain facilities, including the following:            Patient/family informed of bed offers received.  Patient chooses bed at       Physician recommends and patient chooses bed at      Patient to be transferred to   on  .  Patient to be transferred to facility by       Patient family notified on   of transfer.  Name of family member notified:        PHYSICIAN       Additional Comment:    _______________________________________________ Sable Feil, LCSW 05/17/2015, 3:50  PM

## 2015-05-17 NOTE — Progress Notes (Signed)
Orthopedic Tech Progress Note Patient Details:  Chelsey Campbell 1928-11-13 [id number] Sling delivered to room for application in the morning when patient gets up Ortho Devices Type of Ortho Device: Arm sling Ortho Device/Splint Interventions: Ordered   [name] 05/17/2015, 2:29 AM

## 2015-05-17 NOTE — Consult Note (Signed)
Reason for Consult: Acute renal failure on chronic kidney disease stage III Referring Physician: Lala Lund M.D. [facility])  HPI:  81 year old Caucasian woman with a history of hypertension, peripheral vascular disease, complex cardiac history status post pacemaker placement for tachybradycardia syndrome, severe aortic stenosis status post bioprosthetic aVR 3 years ago and baseline chronic kidney disease stage III (creatinine around 1.7) for which he sees Dr. Mercy Moore at [facility].  She was briefly in the hospital after having sustained a comminuted complex fracture of the left elbow for which he underwent ORIF of the olecranon and radial head on 05/05/15. She was discharged on Bactrim and her Hyzaar was continued postoperatively. She was brought to the emergency room 2 days ago after having sustained a fall at home without any orthostasis/syncopal event. Sustained a laceration of her scalp that was repaired in the emergency room. On admission she was also noted to be in acute renal failure that was attributed to her poor oral intake and ongoing medications. Concern raised with continued worsening of her renal function in spite of intravenous fluids.   05/15/2015  05/16/2015  05/17/2015   BUN 44 (H) 41 (H) 51 (H)  Creatinine 2.70 (H) 2.78 (H) 2.93 (H)   She had a renal ultrasound done earlier today that does not show any hydronephrosis-consistent with CKD. Urinalysis done on admission was unremarkable.   Past Medical History  Diagnosis Date  . Hypertension   . Hyperlipidemia   . Insomnia   . Overweight(278.02)   . Vaginal prolapse     with pesary  . S/P cardiac pacemaker procedure November 2011    for tachy/brady syndrome  . Peripheral vascular disease     S/P  femoral bypass  . History of TIA (transient ischemic attack) 2011    TIA episode without an actual TIA   . Bursitis     in joints - hips/neck  . Aortic stenosis, severe   . Ventricular dysfunction     Left mild with EF 45-50%  . CHF  (congestive heart failure)   . COPD (chronic obstructive pulmonary disease)   . SVD (spontaneous vaginal delivery)     x 3  . Renal insufficiency     borderline - r/t bp - Dr Mercy Moore at [facility]  . Pacemaker     [employer]  . Shortness of breath     occasionally with exertion  . Heart murmur   . GERD (gastroesophageal reflux disease)   . Anemia     history  . CAD (coronary artery disease)   . Atrial fibrillation   . Borderline diabetes   . Arthritis     Past Surgical History  Procedure Laterality Date  . Eye surgery       Bilateral Cataracts  . Coronary artery bypass graft  2013    replaced aortic valve  . Pad  2009    vein right arm to left leg   . Wrist surgery  2005    fell broke both wrists and had reconstrictive surgery on both wrists  . Cardiac valve replacement  2013    aorta replacement w/ stent  . Insert / replace / remove pacemaker  2011    [employer]  . Laparoscopic assisted vaginal hysterectomy N/A 01/12/2013    Procedure: LAPAROSCOPIC ASSISTED VAGINAL HYSTERECTOMY;  Surgeon: Maeola Sarah. Landry Mellow, MD;  Location: [facility];  Service: Gynecology;  Laterality: N/A;  . Salpingoophorectomy Bilateral 01/12/2013    Procedure: SALPINGO OOPHORECTOMY;  Surgeon: Maeola Sarah. Landry Mellow, MD;  Location:  [place];  Service: Gynecology;  Laterality: Bilateral;  . Cystocele repair N/A 01/12/2013    Procedure: ANTERIOR REPAIR (CYSTOCELE);  Surgeon: Maeola Sarah. Landry Mellow, MD;  Location: [place];  Service: Gynecology;  Laterality: N/A;  . Cystoscopy N/A 01/12/2013    Procedure: CYSTOSCOPY;  Surgeon: Maeola Sarah. Landry Mellow, MD;  Location: [facility];  Service: Gynecology;  Laterality: N/A;  . Abdominal hysterectomy  01-12-13  . Orif ulnar fracture Left 05/05/2015    Procedure: OPEN REDUCTION INTERNAL FIXATION (ORIF) ULNAR FRACTURE;  Surgeon: Roseanne Kaufman, MD;  Location: [place];  Service: Orthopedics;  Laterality: Left;    Family History  Problem Relation Age of Onset  . Heart disease Father   . Heart  attack Father 58  . Diabetes Father   . Cancer Sister     brain  . Heart disease Brother   . Diabetes Daughter   . Hyperlipidemia Son     Social History:  reports that she quit smoking about 4 years ago. Her smoking use included Cigarettes. She has a 60 pack-year smoking history. She has never used smokeless tobacco. She reports that she does not drink alcohol or use illicit drugs.  Allergies:  Allergies  Allergen Reactions  . Procaine Hcl Anaphylaxis and Other (See Comments)    dizziness  . Morphine And Related Other (See Comments)    Patient gets disoriented/lost memory and doesn't tolerate it well.  Patient does not want morphine used.  . Ciprofloxacin Other (See Comments)    Infusion site reaction/ burning and redness  . Labetalol Hcl Other (See Comments)    Affects kidneys  . Penicillins Swelling, Rash and Other (See Comments)    blisters    Medications:  Scheduled: . amLODipine  2.5 mg Oral QHS  . aspirin EC  81 mg Oral QHS  . heparin  5,000 Units Subcutaneous 3 times per day  . metoprolol  100 mg Oral BID  . nortriptyline  10 mg Oral QHS  . sodium bicarbonate  650 mg Oral BID  . sodium chloride  3 mL Intravenous Q12H  . Umeclidinium Bromide  1 puff Inhalation Daily    BMP Latest Ref Rng 05/17/2015 05/16/2015 05/15/2015  Glucose 65 - 99 mg/dL 89 117(H) -  BUN 6 - 20 mg/dL 51(H) 41(H) -  Creatinine 0.44 - 1.00 mg/dL 2.93(H) 2.78(H) 2.54(H)  Sodium 135 - 145 mmol/L 133(L) 132(L) -  Potassium 3.5 - 5.1 mmol/L 4.7 4.7 -  Chloride 101 - 111 mmol/L 110 105 -  CO2 22 - 32 mmol/L 17(L) 16(L) -  Calcium 8.9 - 10.3 mg/dL 8.4(L) 8.3(L) -    CBC Latest Ref Rng 05/16/2015 05/15/2015 05/15/2015  WBC 4.0 - 10.5 K/uL 8.9 9.5 -  Hemoglobin 12.0 - 15.0 g/dL 10.1(L) 10.3(L) 12.6  Hematocrit 36.0 - 46.0 % 29.8(L) 30.8(L) 37.0  Platelets 150 - 400 K/uL 172 193 -    Urinalysis from 05/15/15 shows amber colored urine, SG 1024, pH 5.0, negative protein, negative leukocytes and negative  blood.  Ultrasound today shows no hydronephrosis-bilateral cortical thinning right >left with 2 right renal cysts. Left kidney 11.3 cm, right kidney 8.7 cm.  Dg Forearm Left  05/15/2015   CLINICAL DATA:  Pain status post fall.  EXAM: LEFT FOREARM - 2 VIEW  COMPARISON:  05/03/2015  FINDINGS: There has been an interval plate and screw fixation of the previously demonstrated proximal ulnar fracture, and distal radial fracture. There is some callus formation. Comminution of the olecranon is noted. There is decreased soft  tissue swelling. There is no evidence of new acute fracture or dislocation. Cast material obscures detailed evaluation of the soft tissues.  IMPRESSION: Status post plate and screw fixation of proximal ulnar and distal radial fractures, with evidence of minimal healing.  No evidence of new acute fracture or subluxation.   Electronically Signed   By: Fidela Salisbury M.D.   On: 05/15/2015 15:02   Dg Wrist Complete Right  05/15/2015   CLINICAL DATA:  Pain following fall  EXAM: RIGHT WRIST - COMPLETE 3+ VIEW  COMPARISON:  None.  FINDINGS: Frontal, oblique, lateral, and ulnar deviation scaphoid images were obtained. There is postoperative change in the distal radius with screw and plate fixation in this area. There is chronic avulsion of the ulnar styloid with a bony fragment noted in the triangular fibrocartilage. There is no acute fracture or dislocation. There is moderate narrowing of the radiocarpal joint as well as the scaphotrapezial joint. No erosive change.  IMPRESSION: Evidence of old trauma with remodeling in the distal radius and chronic avulsion of the ulnar styloid. Areas of osteoarthritic change. No acute fracture or dislocation.   Electronically Signed   By: Lowella Grip III M.D.   On: 05/15/2015 14:56   Ct Head Wo Contrast  05/15/2015   CLINICAL DATA:  Patient fell hitting back of head. Several recent syncopal episodes  EXAM: CT HEAD WITHOUT CONTRAST  CT CERVICAL SPINE  WITHOUT CONTRAST  TECHNIQUE: Multidetector CT imaging of the head and cervical spine was performed following the standard protocol without intravenous contrast. Multiplanar CT image reconstructions of the cervical spine were also generated.  COMPARISON:  None.  FINDINGS: CT HEAD FINDINGS  There is moderate diffuse atrophy. There is no intracranial mass, hemorrhage, extra-axial fluid collection, or midline shift. There is patchy small vessel disease in the centra semiovale bilaterally. Elsewhere gray-white compartments appear normal. There is no acute infarct. The bony calvarium appears intact. The mastoid air cells are clear.  CT CERVICAL SPINE FINDINGS  There is no demonstrable fracture. There is slight anterolisthesis of C3 on C4, felt to be due to underlying spondylosis. There is no other appreciable spondylolisthesis. Prevertebral soft tissues and predental space regions are normal. There is moderately severe disc space narrowing at C5-6 and C6-7. There is moderate narrowing at C3-4 and C4-5. There is facet hypertrophy at essentially all levels bilaterally. There is no disc extrusion or stenosis.  There is calcification in both carotid arteries. There is a focal area of calcification in the right lobe of the thyroid. Visualized lung apices show mild scarring.  IMPRESSION: CT head: Atrophy with periventricular small vessel disease. No intracranial mass, hemorrhage, or acute appearing infarct. No extra-axial fluid collections are identified.  CT cervical spine: No demonstrable fracture. Slight spondylolisthesis at C3-4 is felt to be due to underlying spondylosis. There is multilevel osteoarthritic change. No disc extrusion or stenosis appreciable.  There is carotid artery calcification bilaterally. There appears to be fairly significant narrowing at the sites of calcification, particularly on the left. Duplex carotid ultrasound may be helpful for further assessment in this regard. There is a small calcification in  the right lobe of the thyroid of questionable significance.   Electronically Signed   By: Lowella Grip III M.D.   On: 05/15/2015 15:35   Ct Cervical Spine Wo Contrast  05/15/2015   CLINICAL DATA:  Patient fell hitting back of head. Several recent syncopal episodes  EXAM: CT HEAD WITHOUT CONTRAST  CT CERVICAL SPINE WITHOUT CONTRAST  TECHNIQUE: Multidetector CT  imaging of the head and cervical spine was performed following the standard protocol without intravenous contrast. Multiplanar CT image reconstructions of the cervical spine were also generated.  COMPARISON:  None.  FINDINGS: CT HEAD FINDINGS  There is moderate diffuse atrophy. There is no intracranial mass, hemorrhage, extra-axial fluid collection, or midline shift. There is patchy small vessel disease in the centra semiovale bilaterally. Elsewhere gray-white compartments appear normal. There is no acute infarct. The bony calvarium appears intact. The mastoid air cells are clear.  CT CERVICAL SPINE FINDINGS  There is no demonstrable fracture. There is slight anterolisthesis of C3 on C4, felt to be due to underlying spondylosis. There is no other appreciable spondylolisthesis. Prevertebral soft tissues and predental space regions are normal. There is moderately severe disc space narrowing at C5-6 and C6-7. There is moderate narrowing at C3-4 and C4-5. There is facet hypertrophy at essentially all levels bilaterally. There is no disc extrusion or stenosis.  There is calcification in both carotid arteries. There is a focal area of calcification in the right lobe of the thyroid. Visualized lung apices show mild scarring.  IMPRESSION: CT head: Atrophy with periventricular small vessel disease. No intracranial mass, hemorrhage, or acute appearing infarct. No extra-axial fluid collections are identified.  CT cervical spine: No demonstrable fracture. Slight spondylolisthesis at C3-4 is felt to be due to underlying spondylosis. There is multilevel  osteoarthritic change. No disc extrusion or stenosis appreciable.  There is carotid artery calcification bilaterally. There appears to be fairly significant narrowing at the sites of calcification, particularly on the left. Duplex carotid ultrasound may be helpful for further assessment in this regard. There is a small calcification in the right lobe of the thyroid of questionable significance.   Electronically Signed   By: Lowella Grip III M.D.   On: 05/15/2015 15:35   US Soft Tissue Head/neck  05/17/2015   CLINICAL DATA:  Right thyroid nodule identified by cervical CT.  EXAM: THYROID ULTRASOUND  TECHNIQUE: Ultrasound examination of the thyroid gland and adjacent soft tissues was performed.  COMPARISON:  CT of the cervical spine on 05/15/2015  FINDINGS: Right thyroid lobe  Measurements: 3.7 x 1.7 x 1.8 cm. Solid nodule in the mid to lower aspect of the right lobe measures approximately 1.4 x 1.3 x 1.2 cm. Internal macroscopic shadowing calcifications present within this nodule.  Left thyroid lobe  Measurements: 3.7 x 1.6 x 1.4 cm.  No nodules visualized.  Isthmus  Thickness: 0.3 cm.  No nodules visualized.  Lymphadenopathy  None visualized.  Incidental calcified plaque identified in the right common carotid artery.  IMPRESSION: 1. Solitary right thyroid nodule measuring 1.4 cm. Macroscopic calcification present which is more associated with benign nodules. The nodule does not meet size criteria for biopsy. Ultrasound surveillance is recommended in 12 months. 2. Carotid atherosclerosis with calcified plaque identified in the right common carotid artery.   Electronically Signed   By: Aletta Edouard M.D.   On: 05/17/2015 07:43   US Renal  05/17/2015   CLINICAL DATA:  Acute renal failure.  EXAM: RENAL / URINARY TRACT ULTRASOUND COMPLETE  COMPARISON:  None.  FINDINGS: Right Kidney:  Length: 8.7 cm. Diffuse cortical thinning. No hydronephrosis. 2.5 cm x 1.3 cm x 2 cm exophytic cyst arises from the upper pole.  1.4 cm x 0.8 cm x 1.0 cm exophytic cyst arises from the lower pole. No other masses.  Left Kidney:  Length: 11.3 cm. Mild cortical thinning. Normal parenchymal echogenicity. No mass or cyst. No stones.  No hydronephrosis.  Bladder:  Appears normal for degree of bladder distention.  IMPRESSION: 1. No acute finding.  No hydronephrosis. 2. Bilateral cortical thinning, right greater than left. Smaller right kidney. Two right renal cysts.   Electronically Signed   By: Lajean Manes M.D.   On: 05/17/2015 12:45   Dg Chest Port 1 View  05/16/2015   CLINICAL DATA:  Shortness of breath.  EXAM: PORTABLE CHEST - 1 VIEW  COMPARISON:  05/05/2015.  FINDINGS: Trachea is midline. Heart size stable. Pacemaker lead tips project over the right atrium and right ventricle. Lungs are clear. No pleural fluid.  IMPRESSION: No acute findings.   Electronically Signed   By: Lorin Picket M.D.   On: 05/16/2015 14:40   Dg Humerus Left  05/15/2015   CLINICAL DATA:  81 year old female with pain and tenderness in the left upper extremity.  EXAM: LEFT HUMERUS - 2+ VIEW  COMPARISON:  Radiograph 05/03/2015  FINDINGS: Evaluation is limited due to superimposed dressing as well as absence of true lateral projection. There is internal fixation of the previously seen olecranon fracture with plate and screw. There is irregularity of the radial head compatible with previously seen fracture. The radial-capitellar alignment is preserved on the frontal projection. No definite new fracture identified. Evaluation of the distal humerus is limited on the lateral projection.  A left pectoral pacemaker device is partially visualized.  IMPRESSION: Interval placement of fixation plate and screw through the previously seen olecranon fracture. No new fracture identified.   Electronically Signed   By: Anner Crete M.D.   On: 05/15/2015 15:00   Dg Hand Complete Right  05/15/2015   CLINICAL DATA:  Fall.  Pain and tenderness in the right hand.  EXAM: RIGHT HAND  - COMPLETE 3+ VIEW  COMPARISON:  05/15/2015 wrist radiographs  FINDINGS: Osteoarthritis noted with interphalangeal articular space narrowing. Spurring at the interphalangeal joint of the thumb.  Distal radial plate and screw fixator. Old nonunited fracture of the ulnar styloid.  Bony demineralization.  IMPRESSION: 1. 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 No compelling findings of acute bony fracture. 2. Bony demineralization.   Electronically Signed   By: Van Clines M.D.   On: 05/15/2015 14:54    Review of Systems  Constitutional: Positive for malaise/fatigue. Negative for fever, chills and weight loss.  HENT: Negative.   Eyes: Negative.   Respiratory: Negative.   Cardiovascular: Negative.   Gastrointestinal: Positive for heartburn and abdominal pain. Negative for nausea, vomiting, diarrhea and constipation.       Abdominal pain prior to presentation  Genitourinary: Negative.   Musculoskeletal: Negative for myalgias and neck pain.       Weakness of both lower extremities  Skin:       Recent skin rash secondary to penicillin reaction-resolved  Neurological: Positive for focal weakness and weakness. Negative for dizziness and tingling.       Lower extremity weakness   Blood pressure 108/48, pulse 59, temperature 97.4 F (36.3 C), temperature source Oral, resp. rate 18, height 4\' 11"  (1.499 m), weight 69.2 kg (152 lb 8.9 oz), SpO2 99 %. Physical Exam  Nursing note and vitals reviewed. Constitutional: She is oriented to person, place, and time. She appears well-developed and well-nourished. No distress.  HENT:  Head: Normocephalic.  Mouth/Throat: Oropharynx is clear and moist.  Recent laceration over the occipital scalp-healing  Eyes: EOM are normal. Pupils are equal, round, and reactive to light.  Wears corrective glasses  Neck: No JVD present. No thyromegaly present.  Restricted  range of motion status post fall  Cardiovascular: Normal rate and regular rhythm.   No murmur  heard. Respiratory: Effort normal and breath sounds normal. She has no wheezes. She has no rales.  GI: Soft. Bowel sounds are normal. She exhibits no distension. There is no tenderness. There is no rebound.  Musculoskeletal: She exhibits no edema.  Left arm in cast  Neurological: She is alert and oriented to person, place, and time.  Skin: Skin is warm and dry. No rash noted. No erythema.  Psychiatric: She has a normal mood and affect.    Assessment/Plan: 1. Acute renal failure on chronic kidney disease stage III: This appears to be consistent with ischemic ATN from sustained prerenal status versus AIN with recent Bactrim use. She does not have any acute electrolyte abnormalities to prompt intervention at this time and does not have volume overload/uremic symptoms. I would recheck a urinalysis and order for urine electrolytes together with limited serologies. Would recommend to continue her current intravenous fluids given her a admission of not being able to drink much. Continue to avoid nephrotoxicity as you're currently doing and limit procedures involving iodinated intravenous contrast. I will check CPK level as well. 2. Physical deconditioning/recurrent falls with recent left comminuted/compound elbow fracture status post ORIF 3. Anemia: Hemoglobin appears to be relatively stable, no overt losses, continue to monitor for ESA/PRBC needs. Will check iron studies. 4. Metabolic acidosis: Appears to be from chronic kidney disease and likely compounded by acute renal failure-start oral sodium bicarbonate  Ashaki Frosch K. 05/17/2015, 1:38 PM

## 2015-05-17 NOTE — Progress Notes (Signed)
Patient ID: Chelsey Campbell, female   DOB: 1928/06/21, 81 y.o.   MRN: [id number] Patient is a feeding  Patient notes no complaints.  She is moving the fingers well and has no complications regarding her left arm. I asked her to try not to move the shoulder around some much as I do feel she is doing a bit too much with the arm. Thus I do feel the cast is the safest device for her at this time.  No changes from an orthopedic standpoint  Zykeria Laguardia M.D.

## 2015-05-17 NOTE — Progress Notes (Signed)
Patient Demographics:    Chelsey Campbell, is a 81 y.o. female, DOB - 01/12/1928, IOE:[id number]  Admit date - 05/15/2015   Admitting Physician Nita Sells, MD  Outpatient Primary MD for the patient is [name], MD  LOS - 1   Chief Complaint  Patient presents with  . Fall        Subjective:    Chelsey Campbell today has, No headache, No chest pain, No abdominal pain - No Nausea, No new weakness tingling or numbness, No Cough - SOB.     Assessment  & Plan :     1. ARF on CKD 3 - baseline creat 1.7, renal function poor despite adequate hydration, check urine electrolytes, renal ultrasound, avoid nephrotoxic Meds. Per daughter BMP was checked a few months ago by PCP and creatinine was again close to 1.7.  We'll request renal to evaluate. Have added oral bicarbonate for renal failure associated metabolic acidosis.   2. Hyperkalemia due to #1 above resolved.   3. Multiple falls. PT eval will require placement. She has pacemaker placed.   4. PAD. Continue 81 mg aspirin.   5. Moderate dementia. Outpatient follow with PCP and if needed neurology.   6. GERD. On PPI.   7. Dyslipidemia. Continue home dose statin   8. Recent left olecranon and radial fracture from fall. Follow with Dr. Amedeo Plenty and post discharge. Arm under cast.Dr. Amedeo Plenty following. Has some mottling in the left hand fingers.    9. Incidental finding of carotid artery stenosis and questionable right-sided thyroid calcification. Outpatient age-appropriate followed by PCP. Does not appear to be a candidate for carotid artery endarterectomy etc. On aspirin and statin continue for secondary prevention. Thyroid ultrasound appears stable.    10. H/O [employer] pacemaker - tele stable, due to falls requested Cards to  interrogate.    Code Status : Full  Family Communication  : daughter  Disposition Plan  : TBD  Consults  :  Renal, H and Surg  Procedures  : CT head and C spine  DVT Prophylaxis  :   Heparin    Lab Results  Component Value Date   PLT 172 05/16/2015    Inpatient Medications  Scheduled Meds: . amLODipine  2.5 mg Oral QHS  . aspirin EC  81 mg Oral QHS  . heparin  5,000 Units Subcutaneous 3 times per day  . metoprolol  100 mg Oral BID  . nortriptyline  10 mg Oral QHS  . sodium bicarbonate  650 mg Oral BID  . sodium chloride  3 mL Intravenous Q12H  . Umeclidinium Bromide  1 puff Inhalation Daily   Continuous Infusions: . sodium chloride     PRN Meds:.albuterol, haloperidol lactate, HYDROcodone-acetaminophen  Antibiotics  :     Anti-infectives    None        Objective:   Filed Vitals:   05/16/15 2052 05/16/15 2350 05/17/15 0524 05/17/15 0903  BP: 149/66  139/88 108/48  Pulse: 64 64 89 59  Temp: 98.1 F (36.7 C)  98.3 F (36.8 C) 97.4 F (36.3 C)  TempSrc: Oral  Oral Oral  Resp: 17 18 17 18   Height:      Weight: 69.2 kg (152 lb 8.9 oz)  SpO2: 99%  98% 99%    Wt Readings from Last 3 Encounters:  05/16/15 69.2 kg (152 lb 8.9 oz)  05/05/15 65.772 kg (145 lb)  04/17/14 66.225 kg (146 lb)     Intake/Output Summary (Last 24 hours) at 05/17/15 1020 Last data filed at 05/17/15 0904  Gross per 24 hour  Intake 1528.75 ml  Output    600 ml  Net 928.75 ml     Physical Exam  Awake , Oriented X 1, No new F.N deficits, Normal affect [place].AT,PERRAL Supple Neck,No JVD, No cervical lymphadenopathy appriciated.  Symmetrical Chest wall movement, Good air movement bilaterally, CTAB RRR,No Gallops,Rubs or new Murmurs, No Parasternal Heave +ve B.Sounds, Abd Soft, No tenderness, No organomegaly appriciated, No rebound - guarding or rigidity. No Cyanosis, Clubbing or edema, No new Rash or bruise   Left arm in splint    Data Review:   Micro Results No  results found for this or any previous visit (from the past 240 hour(s)).  Radiology Reports Dg Chest 2 View  05/05/2015   CLINICAL DATA:  Shortness of breath.  EXAM: CHEST  2 VIEW  COMPARISON:  January 12, 2013.  FINDINGS: Stable cardiomediastinal silhouette. Status post cardiac valve repair. Left sided pacemaker is unchanged in position. No pneumothorax or pleural effusion is noted. No acute pulmonary disease is noted.  IMPRESSION: No active cardiopulmonary disease.   Electronically Signed   By: Marijo Conception, M.D.   On: 05/05/2015 10:41   Dg Forearm Left  05/15/2015   CLINICAL DATA:  Pain status post fall.  EXAM: LEFT FOREARM - 2 VIEW  COMPARISON:  05/03/2015  FINDINGS: There has been an interval plate and screw fixation of the previously demonstrated proximal ulnar fracture, and distal radial fracture. There is some callus formation. Comminution of the olecranon is noted. There is decreased soft tissue swelling. There is no evidence of new acute fracture or dislocation. Cast material obscures detailed evaluation of the soft tissues.  IMPRESSION: Status post plate and screw fixation of proximal ulnar and distal radial fractures, with evidence of minimal healing.  No evidence of new acute fracture or subluxation.   Electronically Signed   By: Fidela Salisbury M.D.   On: 05/15/2015 15:02   Dg Wrist Complete Right  05/15/2015   CLINICAL DATA:  Pain following fall  EXAM: RIGHT WRIST - COMPLETE 3+ VIEW  COMPARISON:  None.  FINDINGS: Frontal, oblique, lateral, and ulnar deviation scaphoid images were obtained. There is postoperative change in the distal radius with screw and plate fixation in this area. There is chronic avulsion of the ulnar styloid with a bony fragment noted in the triangular fibrocartilage. There is no acute fracture or dislocation. There is moderate narrowing of the radiocarpal joint as well as the scaphotrapezial joint. No erosive change.  IMPRESSION: Evidence of old trauma with  remodeling in the distal radius and chronic avulsion of the ulnar styloid. Areas of osteoarthritic change. No acute fracture or dislocation.   Electronically Signed   By: Lowella Grip III M.D.   On: 05/15/2015 14:56   Ct Head Wo Contrast  05/15/2015   CLINICAL DATA:  Patient fell hitting back of head. Several recent syncopal episodes  EXAM: CT HEAD WITHOUT CONTRAST  CT CERVICAL SPINE WITHOUT CONTRAST  TECHNIQUE: Multidetector CT imaging of the head and cervical spine was performed following the standard protocol without intravenous contrast. Multiplanar CT image reconstructions of the cervical spine were also generated.  COMPARISON:  None.  FINDINGS: CT HEAD  FINDINGS  There is moderate diffuse atrophy. There is no intracranial mass, hemorrhage, extra-axial fluid collection, or midline shift. There is patchy small vessel disease in the centra semiovale bilaterally. Elsewhere gray-white compartments appear normal. There is no acute infarct. The bony calvarium appears intact. The mastoid air cells are clear.  CT CERVICAL SPINE FINDINGS  There is no demonstrable fracture. There is slight anterolisthesis of C3 on C4, felt to be due to underlying spondylosis. There is no other appreciable spondylolisthesis. Prevertebral soft tissues and predental space regions are normal. There is moderately severe disc space narrowing at C5-6 and C6-7. There is moderate narrowing at C3-4 and C4-5. There is facet hypertrophy at essentially all levels bilaterally. There is no disc extrusion or stenosis.  There is calcification in both carotid arteries. There is a focal area of calcification in the right lobe of the thyroid. Visualized lung apices show mild scarring.  IMPRESSION: CT head: Atrophy with periventricular small vessel disease. No intracranial mass, hemorrhage, or acute appearing infarct. No extra-axial fluid collections are identified.  CT cervical spine: No demonstrable fracture. Slight spondylolisthesis at C3-4 is felt  to be due to underlying spondylosis. There is multilevel osteoarthritic change. No disc extrusion or stenosis appreciable.  There is carotid artery calcification bilaterally. There appears to be fairly significant narrowing at the sites of calcification, particularly on the left. Duplex carotid ultrasound may be helpful for further assessment in this regard. There is a small calcification in the right lobe of the thyroid of questionable significance.   Electronically Signed   By: Lowella Grip III M.D.   On: 05/15/2015 15:35   Ct Cervical Spine Wo Contrast  05/15/2015   CLINICAL DATA:  Patient fell hitting back of head. Several recent syncopal episodes  EXAM: CT HEAD WITHOUT CONTRAST  CT CERVICAL SPINE WITHOUT CONTRAST  TECHNIQUE: Multidetector CT imaging of the head and cervical spine was performed following the standard protocol without intravenous contrast. Multiplanar CT image reconstructions of the cervical spine were also generated.  COMPARISON:  None.  FINDINGS: CT HEAD FINDINGS  There is moderate diffuse atrophy. There is no intracranial mass, hemorrhage, extra-axial fluid collection, or midline shift. There is patchy small vessel disease in the centra semiovale bilaterally. Elsewhere gray-white compartments appear normal. There is no acute infarct. The bony calvarium appears intact. The mastoid air cells are clear.  CT CERVICAL SPINE FINDINGS  There is no demonstrable fracture. There is slight anterolisthesis of C3 on C4, felt to be due to underlying spondylosis. There is no other appreciable spondylolisthesis. Prevertebral soft tissues and predental space regions are normal. There is moderately severe disc space narrowing at C5-6 and C6-7. There is moderate narrowing at C3-4 and C4-5. There is facet hypertrophy at essentially all levels bilaterally. There is no disc extrusion or stenosis.  There is calcification in both carotid arteries. There is a focal area of calcification in the right lobe of  the thyroid. Visualized lung apices show mild scarring.  IMPRESSION: CT head: Atrophy with periventricular small vessel disease. No intracranial mass, hemorrhage, or acute appearing infarct. No extra-axial fluid collections are identified.  CT cervical spine: No demonstrable fracture. Slight spondylolisthesis at C3-4 is felt to be due to underlying spondylosis. There is multilevel osteoarthritic change. No disc extrusion or stenosis appreciable.  There is carotid artery calcification bilaterally. There appears to be fairly significant narrowing at the sites of calcification, particularly on the left. Duplex carotid ultrasound may be helpful for further assessment in this regard. There is a  small calcification in the right lobe of the thyroid of questionable significance.   Electronically Signed   By: Lowella Grip III M.D.   On: 05/15/2015 15:35   US Soft Tissue Head/neck  05/17/2015   CLINICAL DATA:  Right thyroid nodule identified by cervical CT.  EXAM: THYROID ULTRASOUND  TECHNIQUE: Ultrasound examination of the thyroid gland and adjacent soft tissues was performed.  COMPARISON:  CT of the cervical spine on 05/15/2015  FINDINGS: Right thyroid lobe  Measurements: 3.7 x 1.7 x 1.8 cm. Solid nodule in the mid to lower aspect of the right lobe measures approximately 1.4 x 1.3 x 1.2 cm. Internal macroscopic shadowing calcifications present within this nodule.  Left thyroid lobe  Measurements: 3.7 x 1.6 x 1.4 cm.  No nodules visualized.  Isthmus  Thickness: 0.3 cm.  No nodules visualized.  Lymphadenopathy  None visualized.  Incidental calcified plaque identified in the right common carotid artery.  IMPRESSION: 1. Solitary right thyroid nodule measuring 1.4 cm. Macroscopic calcification present which is more associated with benign nodules. The nodule does not meet size criteria for biopsy. Ultrasound surveillance is recommended in 12 months. 2. Carotid atherosclerosis with calcified plaque identified in the right  common carotid artery.   Electronically Signed   By: Aletta Edouard M.D.   On: 05/17/2015 07:43   Dg Chest Port 1 View  05/16/2015   CLINICAL DATA:  Shortness of breath.  EXAM: PORTABLE CHEST - 1 VIEW  COMPARISON:  05/05/2015.  FINDINGS: Trachea is midline. Heart size stable. Pacemaker lead tips project over the right atrium and right ventricle. Lungs are clear. No pleural fluid.  IMPRESSION: No acute findings.   Electronically Signed   By: Lorin Picket M.D.   On: 05/16/2015 14:40   Dg Humerus Left  05/15/2015   CLINICAL DATA:  81 year old female with pain and tenderness in the left upper extremity.  EXAM: LEFT HUMERUS - 2+ VIEW  COMPARISON:  Radiograph 05/03/2015  FINDINGS: Evaluation is limited due to superimposed dressing as well as absence of true lateral projection. There is internal fixation of the previously seen olecranon fracture with plate and screw. There is irregularity of the radial head compatible with previously seen fracture. The radial-capitellar alignment is preserved on the frontal projection. No definite new fracture identified. Evaluation of the distal humerus is limited on the lateral projection.  A left pectoral pacemaker device is partially visualized.  IMPRESSION: Interval placement of fixation plate and screw through the previously seen olecranon fracture. No new fracture identified.   Electronically Signed   By: Anner Crete M.D.   On: 05/15/2015 15:00   Dg Hand Complete Right  05/15/2015   CLINICAL DATA:  Fall.  Pain and tenderness in the right hand.  EXAM: RIGHT HAND - COMPLETE 3+ VIEW  COMPARISON:  05/15/2015 wrist radiographs  FINDINGS: Osteoarthritis noted with interphalangeal articular space narrowing. Spurring at the interphalangeal joint of the thumb.  Distal radial plate and screw fixator. Old nonunited fracture of the ulnar styloid.  Bony demineralization.  IMPRESSION: 1. 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 No compelling findings of acute bony fracture. 2. Bony  demineralization.   Electronically Signed   By: Van Clines M.D.   On: 05/15/2015 14:54     CBC  Recent Labs Lab 05/15/15 1315 05/15/15 1450 05/15/15 1907 05/16/15 0525  WBC 11.4*  --  9.5 8.9  HGB 11.6* 12.6 10.3* 10.1*  HCT 34.0* 37.0 30.8* 29.8*  PLT 229  --  193 172  MCV 89.9  --  89.8 92.3  MCH 30.7  --  30.0 31.3  MCHC 34.1  --  33.4 33.9  RDW 15.9*  --  15.8* 16.1*  LYMPHSABS 0.7  --   --   --   MONOABS 0.3  --   --   --   EOSABS 0.1  --   --   --   BASOSABS 0.0  --   --   --     Chemistries   Recent Labs Lab 05/15/15 1450 05/15/15 1907 05/16/15 0525 05/17/15 0540  NA 130*  --  132* 133*  K 5.5*  --  4.7 4.7  CL 104  --  105 110  CO2  --   --  16* 17*  GLUCOSE 120*  --  117* 89  BUN 44*  --  41* 51*  CREATININE 2.70* 2.54* 2.78* 2.93*  CALCIUM  --   --  8.3* 8.4*  AST  --   --  42*  --   ALT  --   --  46  --   ALKPHOS  --   --  61  --   BILITOT  --   --  0.3  --    ------------------------------------------------------------------------------------------------------------------ estimated creatinine clearance is 12.1 mL/min (by C-G formula based on Cr of 2.93). ------------------------------------------------------------------------------------------------------------------ No results for input(s): HGBA1C in the last 72 hours. ------------------------------------------------------------------------------------------------------------------ No results for input(s): CHOL, HDL, LDLCALC, TRIG, CHOLHDL, LDLDIRECT in the last 72 hours. ------------------------------------------------------------------------------------------------------------------  Recent Labs  05/17/15 0540  TSH 2.527   ------------------------------------------------------------------------------------------------------------------ No results for input(s): VITAMINB12, FOLATE, FERRITIN, TIBC, IRON, RETICCTPCT in the last 72 hours.  Coagulation profile  Recent Labs Lab  05/16/15 0525  INR 1.27    No results for input(s): DDIMER in the last 72 hours.  Cardiac Enzymes No results for input(s): CKMB, TROPONINI, MYOGLOBIN in the last 168 hours.  Invalid input(s): CK ------------------------------------------------------------------------------------------------------------------ Invalid input(s): POCBNP   Time Spent in minutes   35   Justyna Timoney K M.D on 05/17/2015 at 10:20 AM  Between 7am to 7pm - Pager - [phone]  After 7pm go to [email or web address] - password [facility]  [facility]   Office  [phone]

## 2015-05-18 LAB — BASIC METABOLIC PANEL
ANION GAP: 9 (ref 5–15)
BUN: 62 mg/dL — ABNORMAL HIGH (ref 6–20)
CALCIUM: 8.6 mg/dL — AB (ref 8.9–10.3)
CO2: 15 mmol/L — ABNORMAL LOW (ref 22–32)
CREATININE: 3.36 mg/dL — AB (ref 0.44–1.00)
Chloride: 111 mmol/L (ref 101–111)
GFR calc Af Amer: 13 mL/min — ABNORMAL LOW (ref >60)
GFR calc non Af Amer: 12 mL/min — ABNORMAL LOW (ref >60)
GLUCOSE: 132 mg/dL — AB (ref 65–99)
POTASSIUM: 5.7 mmol/L — AB (ref 3.5–5.1)
Sodium: 135 mmol/L (ref 135–145)

## 2015-05-18 MED ORDER — ACETAMINOPHEN 500 MG PO TABS
500.0000 mg | ORAL_TABLET | Freq: Four times a day (QID) | ORAL | Status: DC | PRN
  Administered 2015-05-18 – 2015-05-21 (×10): 500 mg via ORAL
  Filled 2015-05-18 (×10): qty 1

## 2015-05-18 MED ORDER — METOPROLOL TARTRATE 50 MG PO TABS
75.0000 mg | ORAL_TABLET | Freq: Two times a day (BID) | ORAL | Status: DC
  Administered 2015-05-18 – 2015-05-22 (×8): 75 mg via ORAL
  Filled 2015-05-18 (×10): qty 1

## 2015-05-18 MED ORDER — SODIUM POLYSTYRENE SULFONATE 15 GM/60ML PO SUSP
30.0000 g | Freq: Once | ORAL | Status: AC
  Administered 2015-05-18: 30 g via ORAL
  Filled 2015-05-18: qty 120

## 2015-05-18 MED ORDER — ZOLPIDEM TARTRATE 5 MG PO TABS
5.0000 mg | ORAL_TABLET | Freq: Every evening | ORAL | Status: DC | PRN
  Administered 2015-05-18 – 2015-05-20 (×2): 5 mg via ORAL
  Filled 2015-05-18 (×2): qty 1

## 2015-05-18 MED ORDER — DIPHENHYDRAMINE HCL 25 MG PO CAPS: 25.0000 mg | ORAL_CAPSULE | Freq: Every evening | ORAL | Status: DC | PRN

## 2015-05-18 MED ORDER — SODIUM CHLORIDE 0.9 % IV SOLN
INTRAVENOUS | Status: DC
  Administered 2015-05-18 – 2015-05-19 (×3): via INTRAVENOUS

## 2015-05-18 MED ORDER — SODIUM BICARBONATE 650 MG PO TABS
650.0000 mg | ORAL_TABLET | Freq: Three times a day (TID) | ORAL | Status: DC
  Administered 2015-05-18 – 2015-05-20 (×9): 650 mg via ORAL
  Filled 2015-05-18 (×10): qty 1

## 2015-05-18 MED ORDER — DOCUSATE SODIUM 100 MG PO CAPS
200.0000 mg | ORAL_CAPSULE | Freq: Two times a day (BID) | ORAL | Status: DC
  Administered 2015-05-18 – 2015-05-22 (×7): 200 mg via ORAL
  Filled 2015-05-18 (×9): qty 2

## 2015-05-18 NOTE — Clinical Social Work Placement (Signed)
   CLINICAL SOCIAL WORK PLACEMENT  NOTE  Date:  05/18/2015  Patient Details  Name: Chelsey Campbell MRN: [id number] Date of Birth: [birth year]  Clinical Social Work is seeking post-discharge placement for this patient at the [place] level of care (*CSW will initial, date and re-position this form in  chart as items are completed):  Yes   Patient/family provided with [place] Work Department's list of facilities offering this level of care within the geographic area requested by the patient (or if unable, by the patient's family).  Yes   Patient/family informed of their freedom to choose among providers that offer the needed level of care, that participate in Medicare, Medicaid or managed care program needed by the patient, have an available bed and are willing to accept the patient.  Yes   Patient/family informed of [facility]'s ownership interest in [facility] and [facility], as well as of the fact that they are under no obligation to receive care at these facilities.  PASRR submitted to EDS on 05/17/15     PASRR number received on 05/17/15     Existing PASRR number confirmed on       FL2 transmitted to all facilities in geographic area requested by pt/family on 05/17/15     FL2 transmitted to all facilities within larger geographic area on       Patient informed that his/her managed care company has contracts with or will negotiate with certain facilities, including the following:         05/18/15 - Patient/family informed of bed offers received.  Patient chooses bed at  [facility] skilled facility     Physician recommends and patient chooses bed at      Patient to be transferred to  [facility] on  .  Patient to be transferred to facility by       Patient family notified on   of transfer.  Name of family member notified:        PHYSICIAN       Additional Comment:     _______________________________________________ Sable Feil, LCSW 05/18/2015, 5:08 PM

## 2015-05-18 NOTE — Progress Notes (Signed)
Patient Demographics:    Chelsey Campbell, is a 81 y.o. female, DOB - [birth year], VEL:[id number]  Admit date - 05/15/2015   Admitting Physician Nita Sells, MD  Outpatient Primary MD for the patient is [name], MD  LOS - 2   Chief Complaint  Patient presents with  . Fall        Subjective:    Chelsey Campbell today has, No headache, No chest pain, No abdominal pain - No Nausea, No new weakness tingling or numbness, No Cough - SOB.     Assessment  & Plan :     1. ARF on CKD 3 - baseline creat 1.7, renal function poor despite adequate hydration, check urine electrolytes, stable renal ultrasound, avoid nephrotoxic Meds. Per daughter BMP was checked a few months ago by PCP and creatinine was again close to 1.7.  Renal following, have increased IV fluid rate for 24 hours as no evidence of fluid overload. Have added oral bicarbonate for renal failure associated metabolic acidosis. Poor candidate for dialysis.   2. Hyperkalemia due to #1 - give Kayexalate on 05/18/2015 along with bicarbonate orally will monitor.   3. Multiple falls. PT eval will require placement. She has pacemaker placed.   4. PAD. Continue 81 mg aspirin.   5. Moderate dementia. Outpatient follow with PCP and if needed neurology.   6. GERD. On PPI.   7. Dyslipidemia. Continue home dose statin   8. Recent left olecranon and radial fracture from fall. Follow with Dr. Amedeo Plenty and post discharge. Arm under cast.Dr. Amedeo Plenty following. Has some mottling in the left hand fingers.    9. Incidental finding of carotid artery stenosis and questionable right-sided thyroid calcification. Outpatient age-appropriate followed by PCP. Does not appear to be a candidate for carotid artery endarterectomy etc. On aspirin and statin  continue for secondary prevention. Thyroid ultrasound appears stable.    10. H/O [employer] pacemaker - tele stable, due to falls requested Cards to interrogate.        Code Status : Full  Family Communication  : daughter and great grand daughter  Disposition Plan  : TBD  Consults  :  Renal, Hand Surg  Procedures  : CT head and C spine  DVT Prophylaxis  :   Heparin    Lab Results  Component Value Date   PLT 172 05/16/2015    Inpatient Medications  Scheduled Meds: . amLODipine  2.5 mg Oral QHS  . aspirin EC  81 mg Oral QHS  . heparin  5,000 Units Subcutaneous 3 times per day  . metoprolol  100 mg Oral BID  . nortriptyline  10 mg Oral QHS  . senna-docusate  1 tablet Oral QHS  . sodium bicarbonate  650 mg Oral TID  . sodium chloride  3 mL Intravenous Q12H  . Umeclidinium Bromide  1 puff Inhalation Daily   Continuous Infusions: . sodium chloride     PRN Meds:.albuterol, haloperidol lactate, HYDROcodone-acetaminophen  Antibiotics  :     Anti-infectives    None        Objective:   Filed Vitals:   05/17/15 1736 05/17/15 2044 05/18/15 0508 05/18/15 0735  BP: 121/66 100/84 122/58 135/61  Pulse: 62 60 58 61  Temp: 98.2 F (  36.8 C) 98.7 F (37.1 C) 98 F (36.7 C) 98.1 F (36.7 C)  TempSrc: Oral Oral Oral Oral  Resp: 18 22 20 20   Height:      Weight:  71.2 kg (156 lb 15.5 oz)    SpO2: 99% 98% 100% 100%    Wt Readings from Last 3 Encounters:  05/17/15 71.2 kg (156 lb 15.5 oz)  05/05/15 65.772 kg (145 lb)  04/17/14 66.225 kg (146 lb)     Intake/Output Summary (Last 24 hours) at 05/18/15 0947 Last data filed at 05/18/15 0700  Gross per 24 hour  Intake    510 ml  Output      0 ml  Net    510 ml     Physical Exam  Awake , Oriented X 1, No new F.N deficits, Normal affect [place].AT,PERRAL Supple Neck,No JVD, No cervical lymphadenopathy appriciated.  Symmetrical Chest wall movement, Good air movement bilaterally, CTAB RRR,No Gallops,Rubs or  new Murmurs, No Parasternal Heave +ve B.Sounds, Abd Soft, No tenderness, No organomegaly appriciated, No rebound - guarding or rigidity. No Cyanosis, Clubbing or edema, No new Rash or bruise   Left arm in splint    Data Review:   Micro Results No results found for this or any previous visit (from the past 240 hour(s)).  Radiology Reports Dg Chest 2 View  05/05/2015   CLINICAL DATA:  Shortness of breath.  EXAM: CHEST  2 VIEW  COMPARISON:  January 12, 2013.  FINDINGS: Stable cardiomediastinal silhouette. Status post cardiac valve repair. Left sided pacemaker is unchanged in position. No pneumothorax or pleural effusion is noted. No acute pulmonary disease is noted.  IMPRESSION: No active cardiopulmonary disease.   Electronically Signed   By: Marijo Conception, M.D.   On: 05/05/2015 10:41   Dg Forearm Left  05/15/2015   CLINICAL DATA:  Pain status post fall.  EXAM: LEFT FOREARM - 2 VIEW  COMPARISON:  05/03/2015  FINDINGS: There has been an interval plate and screw fixation of the previously demonstrated proximal ulnar fracture, and distal radial fracture. There is some callus formation. Comminution of the olecranon is noted. There is decreased soft tissue swelling. There is no evidence of new acute fracture or dislocation. Cast material obscures detailed evaluation of the soft tissues.  IMPRESSION: Status post plate and screw fixation of proximal ulnar and distal radial fractures, with evidence of minimal healing.  No evidence of new acute fracture or subluxation.   Electronically Signed   By: Fidela Salisbury M.D.   On: 05/15/2015 15:02   Dg Wrist Complete Right  05/15/2015   CLINICAL DATA:  Pain following fall  EXAM: RIGHT WRIST - COMPLETE 3+ VIEW  COMPARISON:  None.  FINDINGS: Frontal, oblique, lateral, and ulnar deviation scaphoid images were obtained. There is postoperative change in the distal radius with screw and plate fixation in this area. There is chronic avulsion of the ulnar styloid  with a bony fragment noted in the triangular fibrocartilage. There is no acute fracture or dislocation. There is moderate narrowing of the radiocarpal joint as well as the scaphotrapezial joint. No erosive change.  IMPRESSION: Evidence of old trauma with remodeling in the distal radius and chronic avulsion of the ulnar styloid. Areas of osteoarthritic change. No acute fracture or dislocation.   Electronically Signed   By: Lowella Grip III M.D.   On: 05/15/2015 14:56   Ct Head Wo Contrast  05/15/2015   CLINICAL DATA:  Patient fell hitting back of head. Several recent syncopal  episodes  EXAM: CT HEAD WITHOUT CONTRAST  CT CERVICAL SPINE WITHOUT CONTRAST  TECHNIQUE: Multidetector CT imaging of the head and cervical spine was performed following the standard protocol without intravenous contrast. Multiplanar CT image reconstructions of the cervical spine were also generated.  COMPARISON:  None.  FINDINGS: CT HEAD FINDINGS  There is moderate diffuse atrophy. There is no intracranial mass, hemorrhage, extra-axial fluid collection, or midline shift. There is patchy small vessel disease in the centra semiovale bilaterally. Elsewhere gray-white compartments appear normal. There is no acute infarct. The bony calvarium appears intact. The mastoid air cells are clear.  CT CERVICAL SPINE FINDINGS  There is no demonstrable fracture. There is slight anterolisthesis of C3 on C4, felt to be due to underlying spondylosis. There is no other appreciable spondylolisthesis. Prevertebral soft tissues and predental space regions are normal. There is moderately severe disc space narrowing at C5-6 and C6-7. There is moderate narrowing at C3-4 and C4-5. There is facet hypertrophy at essentially all levels bilaterally. There is no disc extrusion or stenosis.  There is calcification in both carotid arteries. There is a focal area of calcification in the right lobe of the thyroid. Visualized lung apices show mild scarring.  IMPRESSION: CT  head: Atrophy with periventricular small vessel disease. No intracranial mass, hemorrhage, or acute appearing infarct. No extra-axial fluid collections are identified.  CT cervical spine: No demonstrable fracture. Slight spondylolisthesis at C3-4 is felt to be due to underlying spondylosis. There is multilevel osteoarthritic change. No disc extrusion or stenosis appreciable.  There is carotid artery calcification bilaterally. There appears to be fairly significant narrowing at the sites of calcification, particularly on the left. Duplex carotid ultrasound may be helpful for further assessment in this regard. There is a small calcification in the right lobe of the thyroid of questionable significance.   Electronically Signed   By: Lowella Grip III M.D.   On: 05/15/2015 15:35   Ct Cervical Spine Wo Contrast  05/15/2015   CLINICAL DATA:  Patient fell hitting back of head. Several recent syncopal episodes  EXAM: CT HEAD WITHOUT CONTRAST  CT CERVICAL SPINE WITHOUT CONTRAST  TECHNIQUE: Multidetector CT imaging of the head and cervical spine was performed following the standard protocol without intravenous contrast. Multiplanar CT image reconstructions of the cervical spine were also generated.  COMPARISON:  None.  FINDINGS: CT HEAD FINDINGS  There is moderate diffuse atrophy. There is no intracranial mass, hemorrhage, extra-axial fluid collection, or midline shift. There is patchy small vessel disease in the centra semiovale bilaterally. Elsewhere gray-white compartments appear normal. There is no acute infarct. The bony calvarium appears intact. The mastoid air cells are clear.  CT CERVICAL SPINE FINDINGS  There is no demonstrable fracture. There is slight anterolisthesis of C3 on C4, felt to be due to underlying spondylosis. There is no other appreciable spondylolisthesis. Prevertebral soft tissues and predental space regions are normal. There is moderately severe disc space narrowing at C5-6 and C6-7. There is  moderate narrowing at C3-4 and C4-5. There is facet hypertrophy at essentially all levels bilaterally. There is no disc extrusion or stenosis.  There is calcification in both carotid arteries. There is a focal area of calcification in the right lobe of the thyroid. Visualized lung apices show mild scarring.  IMPRESSION: CT head: Atrophy with periventricular small vessel disease. No intracranial mass, hemorrhage, or acute appearing infarct. No extra-axial fluid collections are identified.  CT cervical spine: No demonstrable fracture. Slight spondylolisthesis at C3-4 is felt to be due  to underlying spondylosis. There is multilevel osteoarthritic change. No disc extrusion or stenosis appreciable.  There is carotid artery calcification bilaterally. There appears to be fairly significant narrowing at the sites of calcification, particularly on the left. Duplex carotid ultrasound may be helpful for further assessment in this regard. There is a small calcification in the right lobe of the thyroid of questionable significance.   Electronically Signed   By: Lowella Grip III M.D.   On: 05/15/2015 15:35   US Soft Tissue Head/neck  05/17/2015   CLINICAL DATA:  Right thyroid nodule identified by cervical CT.  EXAM: THYROID ULTRASOUND  TECHNIQUE: Ultrasound examination of the thyroid gland and adjacent soft tissues was performed.  COMPARISON:  CT of the cervical spine on 05/15/2015  FINDINGS: Right thyroid lobe  Measurements: 3.7 x 1.7 x 1.8 cm. Solid nodule in the mid to lower aspect of the right lobe measures approximately 1.4 x 1.3 x 1.2 cm. Internal macroscopic shadowing calcifications present within this nodule.  Left thyroid lobe  Measurements: 3.7 x 1.6 x 1.4 cm.  No nodules visualized.  Isthmus  Thickness: 0.3 cm.  No nodules visualized.  Lymphadenopathy  None visualized.  Incidental calcified plaque identified in the right common carotid artery.  IMPRESSION: 1. Solitary right thyroid nodule measuring 1.4 cm.  Macroscopic calcification present which is more associated with benign nodules. The nodule does not meet size criteria for biopsy. Ultrasound surveillance is recommended in 12 months. 2. Carotid atherosclerosis with calcified plaque identified in the right common carotid artery.   Electronically Signed   By: Aletta Edouard M.D.   On: 05/17/2015 07:43   US Renal  05/17/2015   CLINICAL DATA:  Acute renal failure.  EXAM: RENAL / URINARY TRACT ULTRASOUND COMPLETE  COMPARISON:  None.  FINDINGS: Right Kidney:  Length: 8.7 cm. Diffuse cortical thinning. No hydronephrosis. 2.5 cm x 1.3 cm x 2 cm exophytic cyst arises from the upper pole. 1.4 cm x 0.8 cm x 1.0 cm exophytic cyst arises from the lower pole. No other masses.  Left Kidney:  Length: 11.3 cm. Mild cortical thinning. Normal parenchymal echogenicity. No mass or cyst. No stones. No hydronephrosis.  Bladder:  Appears normal for degree of bladder distention.  IMPRESSION: 1. No acute finding.  No hydronephrosis. 2. Bilateral cortical thinning, right greater than left. Smaller right kidney. Two right renal cysts.   Electronically Signed   By: Lajean Manes M.D.   On: 05/17/2015 12:45   Dg Chest Port 1 View  05/16/2015   CLINICAL DATA:  Shortness of breath.  EXAM: PORTABLE CHEST - 1 VIEW  COMPARISON:  05/05/2015.  FINDINGS: Trachea is midline. Heart size stable. Pacemaker lead tips project over the right atrium and right ventricle. Lungs are clear. No pleural fluid.  IMPRESSION: No acute findings.   Electronically Signed   By: Lorin Picket M.D.   On: 05/16/2015 14:40   Dg Humerus Left  05/15/2015   CLINICAL DATA:  81 year old female with pain and tenderness in the left upper extremity.  EXAM: LEFT HUMERUS - 2+ VIEW  COMPARISON:  Radiograph 05/03/2015  FINDINGS: Evaluation is limited due to superimposed dressing as well as absence of true lateral projection. There is internal fixation of the previously seen olecranon fracture with plate and screw. There is  irregularity of the radial head compatible with previously seen fracture. The radial-capitellar alignment is preserved on the frontal projection. No definite new fracture identified. Evaluation of the distal humerus is limited on the lateral projection.  A left pectoral pacemaker device is partially visualized.  IMPRESSION: Interval placement of fixation plate and screw through the previously seen olecranon fracture. No new fracture identified.   Electronically Signed   By: Anner Crete M.D.   On: 05/15/2015 15:00   Dg Hand Complete Right  05/15/2015   CLINICAL DATA:  Fall.  Pain and tenderness in the right hand.  EXAM: RIGHT HAND - COMPLETE 3+ VIEW  COMPARISON:  05/15/2015 wrist radiographs  FINDINGS: Osteoarthritis noted with interphalangeal articular space narrowing. Spurring at the interphalangeal joint of the thumb.  Distal radial plate and screw fixator. Old nonunited fracture of the ulnar styloid.  Bony demineralization.  IMPRESSION: 1. 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 No compelling findings of acute bony fracture. 2. Bony demineralization.   Electronically Signed   By: Van Clines M.D.   On: 05/15/2015 14:54     CBC  Recent Labs Lab 05/15/15 1315 05/15/15 1450 05/15/15 1907 05/16/15 0525  WBC 11.4*  --  9.5 8.9  HGB 11.6* 12.6 10.3* 10.1*  HCT 34.0* 37.0 30.8* 29.8*  PLT 229  --  193 172  MCV 89.9  --  89.8 92.3  MCH 30.7  --  30.0 31.3  MCHC 34.1  --  33.4 33.9  RDW 15.9*  --  15.8* 16.1*  LYMPHSABS 0.7  --   --   --   MONOABS 0.3  --   --   --   EOSABS 0.1  --   --   --   BASOSABS 0.0  --   --   --     Chemistries   Recent Labs Lab 05/15/15 1450 05/15/15 1907 05/16/15 0525 05/17/15 0540 05/18/15 0530  NA 130*  --  132* 133* 135  K 5.5*  --  4.7 4.7 5.7*  CL 104  --  105 110 111  CO2  --   --  16* 17* 15*  GLUCOSE 120*  --  117* 89 132*  BUN 44*  --  41* 51* 62*  CREATININE 2.70* 2.54* 2.78* 2.93* 3.36*  CALCIUM  --   --  8.3* 8.4* 8.6*  AST  --   --  42*   --   --   ALT  --   --  46  --   --   ALKPHOS  --   --  61  --   --   BILITOT  --   --  0.3  --   --    ------------------------------------------------------------------------------------------------------------------ estimated creatinine clearance is 10.7 mL/min (by C-G formula based on Cr of 3.36). ------------------------------------------------------------------------------------------------------------------ No results for input(s): HGBA1C in the last 72 hours. ------------------------------------------------------------------------------------------------------------------ No results for input(s): CHOL, HDL, LDLCALC, TRIG, CHOLHDL, LDLDIRECT in the last 72 hours. ------------------------------------------------------------------------------------------------------------------  Recent Labs  05/17/15 0540  TSH 2.527   ------------------------------------------------------------------------------------------------------------------  Recent Labs  05/17/15 1540  FERRITIN 913*  TIBC 200*  IRON 19*    Coagulation profile  Recent Labs Lab 05/16/15 0525  INR 1.27    No results for input(s): DDIMER in the last 72 hours.  Cardiac Enzymes No results for input(s): CKMB, TROPONINI, MYOGLOBIN in the last 168 hours.  Invalid input(s): CK ------------------------------------------------------------------------------------------------------------------ Invalid input(s): POCBNP   Time Spent in minutes   35   Maverick Dieudonne K M.D on 05/18/2015 at 9:47 AM  Between 7am to 7pm - Pager - [phone]  After 7pm go to [email or web address] - password [facility]  [facility]   Office  [phone]

## 2015-05-18 NOTE — Care Management Note (Signed)
Case Management Note  Patient Details  Name: Chelsey Campbell MRN: [id number] Date of Birth: 1928-08-08  Subjective/Objective:                 CM following for progression and d/c planning.   Action/Plan: 05/18/2015 Plan now for SNF, however Cr increasing will continue to monitor closely.   Expected Discharge Date:       05/24/2015           Expected Discharge Plan:  Skilled Nursing Facility  In-House Referral:  NA, Clinical Social Work  Discharge planning Services  CM Consult  Post Acute Care Choice:  NA Choice offered to:  Patient, Adult Children  DME Arranged:    DME Agency:     HH Arranged:    [place] Agency:     Status of Service:  Completed, signed off  Medicare Important Message Given:    Date Medicare IM Given:    Medicare IM give by:    Date Additional Medicare IM Given:    Additional Medicare Important Message give by:     If discussed at [place] of Stay Meetings, dates discussed:    Additional Comments:  Adron Bene, RN 05/18/2015, 1:36 PM

## 2015-05-18 NOTE — Progress Notes (Signed)
Assessment/Plan: 1. Acute renal failure on chronic kidney disease stage III: This appears to be consistent with ischemic ATN from sustained prerenal status versus AIN with recent Bactrim use. Also concerned about relatively low BP.  Will stop amlodipine and reduce metoprolol dosage and allow BP to drift upward.  Weight increasing.  Output not recorded.  Will need to avoid overhydration. 2. Physical deconditioning/recurrent falls with recent left comminuted/compound elbow fracture status post ORIF 3. Anemia: Hemoglobin appears to be relatively stable, no overt losses, continue to monitor for ESA/PRBC needs. Will check iron studies. 4. Metabolic acidosis: Appears to be from chronic kidney disease and likely compounded by acute renal failure-start oral sodium bicarbonate  Subjective: Interval History: No new issue  Objective: Vital signs in last 24 hours: Temp:  [98 F (36.7 C)-98.7 F (37.1 C)] 98.1 F (36.7 C) (07/01 0735) Pulse Rate:  [58-62] 61 (07/01 0735) Resp:  [18-22] 20 (07/01 0735) BP: (100-135)/(58-84) 135/61 mmHg (07/01 0735) SpO2:  [98 %-100 %] 100 % (07/01 0735) Weight:  [71.2 kg (156 lb 15.5 oz)] 71.2 kg (156 lb 15.5 oz) (06/30 2044) Weight change: 2 kg (4 lb 6.6 oz)  Intake/Output from previous day: 06/30 0701 - 07/01 0700 In: 810 [P.O.:810] Out: -  Intake/Output this shift:    General appearance: alert and cooperative Resp: clear to auscultation bilaterally Chest wall: no tenderness Cardio: regular rate and rhythm, S1, S2 normal, no murmur, click, rub or gallop Extremities: edema tr  Lab Results:  Recent Labs  05/15/15 1907 05/16/15 0525  WBC 9.5 8.9  HGB 10.3* 10.1*  HCT 30.8* 29.8*  PLT 193 172   BMET:  Recent Labs  05/17/15 0540 05/18/15 0530  NA 133* 135  K 4.7 5.7*  CL 110 111  CO2 17* 15*  GLUCOSE 89 132*  BUN 51* 62*  CREATININE 2.93* 3.36*  CALCIUM 8.4* 8.6*   No results for input(s): PTH in the last 72 hours. Iron Studies:  Recent  Labs  05/17/15 1540  IRON 19*  TIBC 200*  FERRITIN 913*   Studies/Results: US Soft Tissue Head/neck  05/17/2015   CLINICAL DATA:  Right thyroid nodule identified by cervical CT.  EXAM: THYROID ULTRASOUND  TECHNIQUE: Ultrasound examination of the thyroid gland and adjacent soft tissues was performed.  COMPARISON:  CT of the cervical spine on 05/15/2015  FINDINGS: Right thyroid lobe  Measurements: 3.7 x 1.7 x 1.8 cm. Solid nodule in the mid to lower aspect of the right lobe measures approximately 1.4 x 1.3 x 1.2 cm. Internal macroscopic shadowing calcifications present within this nodule.  Left thyroid lobe  Measurements: 3.7 x 1.6 x 1.4 cm.  No nodules visualized.  Isthmus  Thickness: 0.3 cm.  No nodules visualized.  Lymphadenopathy  None visualized.  Incidental calcified plaque identified in the right common carotid artery.  IMPRESSION: 1. Solitary right thyroid nodule measuring 1.4 cm. Macroscopic calcification present which is more associated with benign nodules. The nodule does not meet size criteria for biopsy. Ultrasound surveillance is recommended in 12 months. 2. Carotid atherosclerosis with calcified plaque identified in the right common carotid artery.   Electronically Signed   By: Aletta Edouard M.D.   On: 05/17/2015 07:43   US Renal  05/17/2015   CLINICAL DATA:  Acute renal failure.  EXAM: RENAL / URINARY TRACT ULTRASOUND COMPLETE  COMPARISON:  None.  FINDINGS: Right Kidney:  Length: 8.7 cm. Diffuse cortical thinning. No hydronephrosis. 2.5 cm x 1.3 cm x 2 cm exophytic cyst arises from the  upper pole. 1.4 cm x 0.8 cm x 1.0 cm exophytic cyst arises from the lower pole. No other masses.  Left Kidney:  Length: 11.3 cm. Mild cortical thinning. Normal parenchymal echogenicity. No mass or cyst. No stones. No hydronephrosis.  Bladder:  Appears normal for degree of bladder distention.  IMPRESSION: 1. No acute finding.  No hydronephrosis. 2. Bilateral cortical thinning, right greater than left.  Smaller right kidney. Two right renal cysts.   Electronically Signed   By: Lajean Manes M.D.   On: 05/17/2015 12:45   Dg Chest Port 1 View  05/16/2015   CLINICAL DATA:  Shortness of breath.  EXAM: PORTABLE CHEST - 1 VIEW  COMPARISON:  05/05/2015.  FINDINGS: Trachea is midline. Heart size stable. Pacemaker lead tips project over the right atrium and right ventricle. Lungs are clear. No pleural fluid.  IMPRESSION: No acute findings.   Electronically Signed   By: Lorin Picket M.D.   On: 05/16/2015 14:40   Scheduled: . aspirin EC  81 mg Oral QHS  . docusate sodium  200 mg Oral BID  . heparin  5,000 Units Subcutaneous 3 times per day  . metoprolol  75 mg Oral BID  . nortriptyline  10 mg Oral QHS  . senna-docusate  1 tablet Oral QHS  . sodium bicarbonate  650 mg Oral TID  . sodium chloride  3 mL Intravenous Q12H  . Umeclidinium Bromide  1 puff Inhalation Daily     LOS: 2 days   Phyllis Whitefield C 05/18/2015,11:58 AM

## 2015-05-19 LAB — BASIC METABOLIC PANEL
ANION GAP: 11 (ref 5–15)
BUN: 62 mg/dL — AB (ref 6–20)
CALCIUM: 8.5 mg/dL — AB (ref 8.9–10.3)
CHLORIDE: 113 mmol/L — AB (ref 101–111)
CO2: 15 mmol/L — AB (ref 22–32)
CREATININE: 2.97 mg/dL — AB (ref 0.44–1.00)
GFR calc Af Amer: 16 mL/min — ABNORMAL LOW (ref >60)
GFR calc non Af Amer: 13 mL/min — ABNORMAL LOW (ref >60)
Glucose, Bld: 155 mg/dL — ABNORMAL HIGH (ref 65–99)
Potassium: 4.4 mmol/L (ref 3.5–5.1)
Sodium: 139 mmol/L (ref 135–145)

## 2015-05-19 LAB — C4 COMPLEMENT: Complement C4, Body Fluid: 27 mg/dL (ref 14–44)

## 2015-05-19 LAB — C3 COMPLEMENT: C3 Complement: 84 mg/dL (ref 82–167)

## 2015-05-19 MED ORDER — FUROSEMIDE 10 MG/ML IJ SOLN
20.0000 mg | Freq: Once | INTRAMUSCULAR | Status: AC
  Administered 2015-05-19: 20 mg via INTRAVENOUS
  Filled 2015-05-19: qty 2

## 2015-05-19 MED ORDER — SODIUM CHLORIDE 0.9 % IV SOLN
510.0000 mg | Freq: Once | INTRAVENOUS | Status: AC
  Administered 2015-05-19: 510 mg via INTRAVENOUS
  Filled 2015-05-19: qty 17

## 2015-05-19 MED ORDER — SODIUM CHLORIDE 0.9 % IV SOLN
INTRAVENOUS | Status: DC
  Administered 2015-05-19 – 2015-05-20 (×2): via INTRAVENOUS

## 2015-05-19 NOTE — Progress Notes (Signed)
Patient Demographics:    Chelsey Campbell, is a 81 y.o. female, DOB - Jan 25, 1928, QQP:[id number]  Admit date - 05/15/2015   Admitting Physician Nita Sells, MD  Outpatient Primary MD for the patient is [name], MD  LOS - 3   Chief Complaint  Patient presents with  . Fall        Subjective:    Chelsey Campbell today has, No headache, No chest pain, No abdominal pain - No Nausea, No new weakness tingling or numbness, No Cough - SOB.     Assessment  & Plan :     1. ARF on CKD 3 - baseline creat 1.7, mild improvement with hydration, stable renal ultrasound, avoid nephrotoxic Meds. Per daughter BMP was checked a few months ago by PCP and creatinine was again close to 1.7.  Renal following, developing some fluid overload so fluid rate reduced along with one-time dose of Lasix on 05/19/2015, Have added oral bicarbonate for renal failure associated metabolic acidosis along with hyperchloremia. Poor candidate for dialysis.   2. Hyperkalemia due to #1 - given Kayexalate on 05/18/2015 along with bicarbonate orally , potassium levels better.   3. Multiple falls. PT eval will require placement. She has pacemaker placed.   4. PAD. Continue 81 mg aspirin.   5. Moderate dementia. Outpatient follow with PCP and if needed neurology.   6. GERD. On PPI.   7. Dyslipidemia. Continue home dose statin   8. Recent left olecranon and radial fracture from fall. Follow with Dr. Amedeo Plenty and post discharge. Arm under cast.Dr. Amedeo Plenty following. Has some mottling in the left hand fingers.    9. Incidental finding of carotid artery stenosis and questionable right-sided thyroid calcification. Outpatient age-appropriate followed by PCP. Does not appear to be a candidate for carotid artery endarterectomy etc.  On aspirin and statin continue for secondary prevention. Thyroid ultrasound appears stable.    10. H/O [employer] pacemaker - tele stable, due to falls requested Cards to interrogate.        Code Status : Full  Family Communication  : daughter and great grand daughter  Disposition Plan  : TBD  Consults  :  Renal, Hand Surg  Procedures  : CT head and C spine  DVT Prophylaxis  :   Heparin    Lab Results  Component Value Date   PLT 172 05/16/2015    Inpatient Medications  Scheduled Meds: . aspirin EC  81 mg Oral QHS  . docusate sodium  200 mg Oral BID  . furosemide  20 mg Intravenous Once  . heparin  5,000 Units Subcutaneous 3 times per day  . metoprolol  75 mg Oral BID  . nortriptyline  10 mg Oral QHS  . senna-docusate  1 tablet Oral QHS  . sodium bicarbonate  650 mg Oral TID  . sodium chloride  3 mL Intravenous Q12H  . Umeclidinium Bromide  1 puff Inhalation Daily   Continuous Infusions: . sodium chloride 75 mL/hr at 05/19/15 0715   PRN Meds:.acetaminophen, albuterol, haloperidol lactate, zolpidem  Antibiotics  :     Anti-infectives    None        Objective:   Filed Vitals:   05/18/15 1745 05/18/15 2100 05/19/15 0500 05/19/15 7124  BP: 124/55 131/70 126/49 135/79  Pulse: 74 72 54 64  Temp: 98.4 F (36.9 C) 97.7 F (36.5 C) 97.6 F (36.4 C) 97.9 F (36.6 C)  TempSrc: Oral Oral Oral Oral  Resp: 20 20 18 17   Height:      Weight:  71.5 kg (157 lb 10.1 oz)    SpO2: 100% 100% 100% 96%    Wt Readings from Last 3 Encounters:  05/18/15 71.5 kg (157 lb 10.1 oz)  05/05/15 65.772 kg (145 lb)  04/17/14 66.225 kg (146 lb)     Intake/Output Summary (Last 24 hours) at 05/19/15 1047 Last data filed at 05/19/15 1010  Gross per 24 hour  Intake 1333.33 ml  Output     50 ml  Net 1283.33 ml     Physical Exam  Awake , Oriented X 1, No new F.N deficits, Normal affect [place].AT,PERRAL Supple Neck,No JVD, No cervical lymphadenopathy appriciated.   Symmetrical Chest wall movement, Good air movement bilaterally, CTAB RRR,No Gallops,Rubs or new Murmurs, No Parasternal Heave +ve B.Sounds, Abd Soft, No tenderness, No organomegaly appriciated, No rebound - guarding or rigidity. No Cyanosis, Clubbing or edema, No new Rash or bruise   Left arm in splint    Data Review:   Micro Results No results found for this or any previous visit (from the past 240 hour(s)).  Radiology Reports Dg Chest 2 View  05/05/2015   CLINICAL DATA:  Shortness of breath.  EXAM: CHEST  2 VIEW  COMPARISON:  January 12, 2013.  FINDINGS: Stable cardiomediastinal silhouette. Status post cardiac valve repair. Left sided pacemaker is unchanged in position. No pneumothorax or pleural effusion is noted. No acute pulmonary disease is noted.  IMPRESSION: No active cardiopulmonary disease.   Electronically Signed   By: Marijo Conception, M.D.   On: 05/05/2015 10:41   Dg Forearm Left  05/15/2015   CLINICAL DATA:  Pain status post fall.  EXAM: LEFT FOREARM - 2 VIEW  COMPARISON:  05/03/2015  FINDINGS: There has been an interval plate and screw fixation of the previously demonstrated proximal ulnar fracture, and distal radial fracture. There is some callus formation. Comminution of the olecranon is noted. There is decreased soft tissue swelling. There is no evidence of new acute fracture or dislocation. Cast material obscures detailed evaluation of the soft tissues.  IMPRESSION: Status post plate and screw fixation of proximal ulnar and distal radial fractures, with evidence of minimal healing.  No evidence of new acute fracture or subluxation.   Electronically Signed   By: Fidela Salisbury M.D.   On: 05/15/2015 15:02   Dg Wrist Complete Right  05/15/2015   CLINICAL DATA:  Pain following fall  EXAM: RIGHT WRIST - COMPLETE 3+ VIEW  COMPARISON:  None.  FINDINGS: Frontal, oblique, lateral, and ulnar deviation scaphoid images were obtained. There is postoperative change in the distal  radius with screw and plate fixation in this area. There is chronic avulsion of the ulnar styloid with a bony fragment noted in the triangular fibrocartilage. There is no acute fracture or dislocation. There is moderate narrowing of the radiocarpal joint as well as the scaphotrapezial joint. No erosive change.  IMPRESSION: Evidence of old trauma with remodeling in the distal radius and chronic avulsion of the ulnar styloid. Areas of osteoarthritic change. No acute fracture or dislocation.   Electronically Signed   By: Lowella Grip III M.D.   On: 05/15/2015 14:56   Ct Head Wo Contrast  05/15/2015   CLINICAL DATA:  Patient  fell hitting back of head. Several recent syncopal episodes  EXAM: CT HEAD WITHOUT CONTRAST  CT CERVICAL SPINE WITHOUT CONTRAST  TECHNIQUE: Multidetector CT imaging of the head and cervical spine was performed following the standard protocol without intravenous contrast. Multiplanar CT image reconstructions of the cervical spine were also generated.  COMPARISON:  None.  FINDINGS: CT HEAD FINDINGS  There is moderate diffuse atrophy. There is no intracranial mass, hemorrhage, extra-axial fluid collection, or midline shift. There is patchy small vessel disease in the centra semiovale bilaterally. Elsewhere gray-white compartments appear normal. There is no acute infarct. The bony calvarium appears intact. The mastoid air cells are clear.  CT CERVICAL SPINE FINDINGS  There is no demonstrable fracture. There is slight anterolisthesis of C3 on C4, felt to be due to underlying spondylosis. There is no other appreciable spondylolisthesis. Prevertebral soft tissues and predental space regions are normal. There is moderately severe disc space narrowing at C5-6 and C6-7. There is moderate narrowing at C3-4 and C4-5. There is facet hypertrophy at essentially all levels bilaterally. There is no disc extrusion or stenosis.  There is calcification in both carotid arteries. There is a focal area of  calcification in the right lobe of the thyroid. Visualized lung apices show mild scarring.  IMPRESSION: CT head: Atrophy with periventricular small vessel disease. No intracranial mass, hemorrhage, or acute appearing infarct. No extra-axial fluid collections are identified.  CT cervical spine: No demonstrable fracture. Slight spondylolisthesis at C3-4 is felt to be due to underlying spondylosis. There is multilevel osteoarthritic change. No disc extrusion or stenosis appreciable.  There is carotid artery calcification bilaterally. There appears to be fairly significant narrowing at the sites of calcification, particularly on the left. Duplex carotid ultrasound may be helpful for further assessment in this regard. There is a small calcification in the right lobe of the thyroid of questionable significance.   Electronically Signed   By: Lowella Grip III M.D.   On: 05/15/2015 15:35   Ct Cervical Spine Wo Contrast  05/15/2015   CLINICAL DATA:  Patient fell hitting back of head. Several recent syncopal episodes  EXAM: CT HEAD WITHOUT CONTRAST  CT CERVICAL SPINE WITHOUT CONTRAST  TECHNIQUE: Multidetector CT imaging of the head and cervical spine was performed following the standard protocol without intravenous contrast. Multiplanar CT image reconstructions of the cervical spine were also generated.  COMPARISON:  None.  FINDINGS: CT HEAD FINDINGS  There is moderate diffuse atrophy. There is no intracranial mass, hemorrhage, extra-axial fluid collection, or midline shift. There is patchy small vessel disease in the centra semiovale bilaterally. Elsewhere gray-white compartments appear normal. There is no acute infarct. The bony calvarium appears intact. The mastoid air cells are clear.  CT CERVICAL SPINE FINDINGS  There is no demonstrable fracture. There is slight anterolisthesis of C3 on C4, felt to be due to underlying spondylosis. There is no other appreciable spondylolisthesis. Prevertebral soft tissues and  predental space regions are normal. There is moderately severe disc space narrowing at C5-6 and C6-7. There is moderate narrowing at C3-4 and C4-5. There is facet hypertrophy at essentially all levels bilaterally. There is no disc extrusion or stenosis.  There is calcification in both carotid arteries. There is a focal area of calcification in the right lobe of the thyroid. Visualized lung apices show mild scarring.  IMPRESSION: CT head: Atrophy with periventricular small vessel disease. No intracranial mass, hemorrhage, or acute appearing infarct. No extra-axial fluid collections are identified.  CT cervical spine: No demonstrable fracture. Slight  spondylolisthesis at C3-4 is felt to be due to underlying spondylosis. There is multilevel osteoarthritic change. No disc extrusion or stenosis appreciable.  There is carotid artery calcification bilaterally. There appears to be fairly significant narrowing at the sites of calcification, particularly on the left. Duplex carotid ultrasound may be helpful for further assessment in this regard. There is a small calcification in the right lobe of the thyroid of questionable significance.   Electronically Signed   By: Lowella Grip III M.D.   On: 05/15/2015 15:35   US Soft Tissue Head/neck  05/17/2015   CLINICAL DATA:  Right thyroid nodule identified by cervical CT.  EXAM: THYROID ULTRASOUND  TECHNIQUE: Ultrasound examination of the thyroid gland and adjacent soft tissues was performed.  COMPARISON:  CT of the cervical spine on 05/15/2015  FINDINGS: Right thyroid lobe  Measurements: 3.7 x 1.7 x 1.8 cm. Solid nodule in the mid to lower aspect of the right lobe measures approximately 1.4 x 1.3 x 1.2 cm. Internal macroscopic shadowing calcifications present within this nodule.  Left thyroid lobe  Measurements: 3.7 x 1.6 x 1.4 cm.  No nodules visualized.  Isthmus  Thickness: 0.3 cm.  No nodules visualized.  Lymphadenopathy  None visualized.  Incidental calcified plaque  identified in the right common carotid artery.  IMPRESSION: 1. Solitary right thyroid nodule measuring 1.4 cm. Macroscopic calcification present which is more associated with benign nodules. The nodule does not meet size criteria for biopsy. Ultrasound surveillance is recommended in 12 months. 2. Carotid atherosclerosis with calcified plaque identified in the right common carotid artery.   Electronically Signed   By: Aletta Edouard M.D.   On: 05/17/2015 07:43   US Renal  05/17/2015   CLINICAL DATA:  Acute renal failure.  EXAM: RENAL / URINARY TRACT ULTRASOUND COMPLETE  COMPARISON:  None.  FINDINGS: Right Kidney:  Length: 8.7 cm. Diffuse cortical thinning. No hydronephrosis. 2.5 cm x 1.3 cm x 2 cm exophytic cyst arises from the upper pole. 1.4 cm x 0.8 cm x 1.0 cm exophytic cyst arises from the lower pole. No other masses.  Left Kidney:  Length: 11.3 cm. Mild cortical thinning. Normal parenchymal echogenicity. No mass or cyst. No stones. No hydronephrosis.  Bladder:  Appears normal for degree of bladder distention.  IMPRESSION: 1. No acute finding.  No hydronephrosis. 2. Bilateral cortical thinning, right greater than left. Smaller right kidney. Two right renal cysts.   Electronically Signed   By: Lajean Manes M.D.   On: 05/17/2015 12:45   Dg Chest Port 1 View  05/16/2015   CLINICAL DATA:  Shortness of breath.  EXAM: PORTABLE CHEST - 1 VIEW  COMPARISON:  05/05/2015.  FINDINGS: Trachea is midline. Heart size stable. Pacemaker lead tips project over the right atrium and right ventricle. Lungs are clear. No pleural fluid.  IMPRESSION: No acute findings.   Electronically Signed   By: Lorin Picket M.D.   On: 05/16/2015 14:40   Dg Humerus Left  05/15/2015   CLINICAL DATA:  81 year old female with pain and tenderness in the left upper extremity.  EXAM: LEFT HUMERUS - 2+ VIEW  COMPARISON:  Radiograph 05/03/2015  FINDINGS: Evaluation is limited due to superimposed dressing as well as absence of true lateral  projection. There is internal fixation of the previously seen olecranon fracture with plate and screw. There is irregularity of the radial head compatible with previously seen fracture. The radial-capitellar alignment is preserved on the frontal projection. No definite new fracture identified. Evaluation of the distal  humerus is limited on the lateral projection.  A left pectoral pacemaker device is partially visualized.  IMPRESSION: Interval placement of fixation plate and screw through the previously seen olecranon fracture. No new fracture identified.   Electronically Signed   By: Anner Crete M.D.   On: 05/15/2015 15:00   Dg Hand Complete Right  05/15/2015   CLINICAL DATA:  Fall.  Pain and tenderness in the right hand.  EXAM: RIGHT HAND - COMPLETE 3+ VIEW  COMPARISON:  05/15/2015 wrist radiographs  FINDINGS: Osteoarthritis noted with interphalangeal articular space narrowing. Spurring at the interphalangeal joint of the thumb.  Distal radial plate and screw fixator. Old nonunited fracture of the ulnar styloid.  Bony demineralization.  IMPRESSION: 1. 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 No compelling findings of acute bony fracture. 2. Bony demineralization.   Electronically Signed   By: Van Clines M.D.   On: 05/15/2015 14:54     CBC  Recent Labs Lab 05/15/15 1315 05/15/15 1450 05/15/15 1907 05/16/15 0525  WBC 11.4*  --  9.5 8.9  HGB 11.6* 12.6 10.3* 10.1*  HCT 34.0* 37.0 30.8* 29.8*  PLT 229  --  193 172  MCV 89.9  --  89.8 92.3  MCH 30.7  --  30.0 31.3  MCHC 34.1  --  33.4 33.9  RDW 15.9*  --  15.8* 16.1*  LYMPHSABS 0.7  --   --   --   MONOABS 0.3  --   --   --   EOSABS 0.1  --   --   --   BASOSABS 0.0  --   --   --     Chemistries   Recent Labs Lab 05/15/15 1450 05/15/15 1907 05/16/15 0525 05/17/15 0540 05/18/15 0530 05/19/15 0426  NA 130*  --  132* 133* 135 139  K 5.5*  --  4.7 4.7 5.7* 4.4  CL 104  --  105 110 111 113*  CO2  --   --  16* 17* 15* 15*  GLUCOSE 120*   --  117* 89 132* 155*  BUN 44*  --  41* 51* 62* 62*  CREATININE 2.70* 2.54* 2.78* 2.93* 3.36* 2.97*  CALCIUM  --   --  8.3* 8.4* 8.6* 8.5*  AST  --   --  42*  --   --   --   ALT  --   --  46  --   --   --   ALKPHOS  --   --  61  --   --   --   BILITOT  --   --  0.3  --   --   --    ------------------------------------------------------------------------------------------------------------------ estimated creatinine clearance is 12.1 mL/min (by C-G formula based on Cr of 2.97). ------------------------------------------------------------------------------------------------------------------ No results for input(s): HGBA1C in the last 72 hours. ------------------------------------------------------------------------------------------------------------------ No results for input(s): CHOL, HDL, LDLCALC, TRIG, CHOLHDL, LDLDIRECT in the last 72 hours. ------------------------------------------------------------------------------------------------------------------  Recent Labs  05/17/15 0540  TSH 2.527   ------------------------------------------------------------------------------------------------------------------  Recent Labs  05/17/15 1540  FERRITIN 913*  TIBC 200*  IRON 19*    Coagulation profile  Recent Labs Lab 05/16/15 0525  INR 1.27    No results for input(s): DDIMER in the last 72 hours.  Cardiac Enzymes No results for input(s): CKMB, TROPONINI, MYOGLOBIN in the last 168 hours.  Invalid input(s): CK ------------------------------------------------------------------------------------------------------------------ Invalid input(s): POCBNP   Time Spent in minutes   35   December Hedtke K M.D on 05/19/2015 at 10:47 AM  Between 7am to  7pm - Pager - [phone]  After 7pm go to [email or web address] - password [facility]  [facility]   Office  [phone]

## 2015-05-19 NOTE — Progress Notes (Signed)
Assessment/Plan: 1. Acute renal failure on chronic kidney disease stage III: ? improving  (consistent with ischemic ATN from sustained prerenal status versus AIN with recent Bactrim use and relatively low BP)Allowing BP to drift upward. Weight increasing. ? Urine Output . 2. Physical deconditioning/recurrent falls with recent left comminuted/compound elbow fracture status post ORIF, for SNF 3. Anemia:Iron defic Will give IV iron 4. Metabolic acidosis: oral sodium bicarbonate   Subjective: Interval History: Feels better  Objective: Vital signs in last 24 hours: Temp:  [97.6 F (36.4 C)-98.4 F (36.9 C)] 97.9 F (36.6 C) (07/02 0817) Pulse Rate:  [54-74] 64 (07/02 0817) Resp:  [17-20] 17 (07/02 0817) BP: (124-135)/(49-79) 135/79 mmHg (07/02 0817) SpO2:  [96 %-100 %] 96 % (07/02 0817) Weight:  [71.5 kg (157 lb 10.1 oz)] 71.5 kg (157 lb 10.1 oz) (07/01 2100) Weight change: 0.3 kg (10.6 oz)  Intake/Output from previous day: 07/01 0701 - 07/02 0700 In: 1333.3 [P.O.:600; I.V.:733.3] Out: -  Intake/Output this shift: Total I/O In: -  Out: 50 [Urine:50]  General appearance: alert and cooperative Resp: diminished breath sounds bilaterally, weak effort Cardio: regular rate and rhythm, S1, S2 normal, no murmur, click, rub or gallop Extremities: edema 1+  Lab Results: No results for input(s): WBC, HGB, HCT, PLT in the last 72 hours. BMET:  Recent Labs  05/18/15 0530 05/19/15 0426  NA 135 139  K 5.7* 4.4  CL 111 113*  CO2 15* 15*  GLUCOSE 132* 155*  BUN 62* 62*  CREATININE 3.36* 2.97*  CALCIUM 8.6* 8.5*   No results for input(s): PTH in the last 72 hours. Iron Studies:  Recent Labs  05/17/15 1540  IRON 19*  TIBC 200*  FERRITIN 913*   Studies/Results: US Renal  05/17/2015   CLINICAL DATA:  Acute renal failure.  EXAM: RENAL / URINARY TRACT ULTRASOUND COMPLETE  COMPARISON:  None.  FINDINGS: Right Kidney:  Length: 8.7 cm. Diffuse cortical thinning. No hydronephrosis.  2.5 cm x 1.3 cm x 2 cm exophytic cyst arises from the upper pole. 1.4 cm x 0.8 cm x 1.0 cm exophytic cyst arises from the lower pole. No other masses.  Left Kidney:  Length: 11.3 cm. Mild cortical thinning. Normal parenchymal echogenicity. No mass or cyst. No stones. No hydronephrosis.  Bladder:  Appears normal for degree of bladder distention.  IMPRESSION: 1. No acute finding.  No hydronephrosis. 2. Bilateral cortical thinning, right greater than left. Smaller right kidney. Two right renal cysts.   Electronically Signed   By: Lajean Manes M.D.   On: 05/17/2015 12:45    Scheduled: . aspirin EC  81 mg Oral QHS  . docusate sodium  200 mg Oral BID  . furosemide  20 mg Intravenous Once  . heparin  5,000 Units Subcutaneous 3 times per day  . metoprolol  75 mg Oral BID  . nortriptyline  10 mg Oral QHS  . senna-docusate  1 tablet Oral QHS  . sodium bicarbonate  650 mg Oral TID  . sodium chloride  3 mL Intravenous Q12H  . Umeclidinium Bromide  1 puff Inhalation Daily     LOS: 3 days   Wafa Martes C 05/19/2015,10:37 AM

## 2015-05-20 LAB — BASIC METABOLIC PANEL
Anion gap: 12 (ref 5–15)
BUN: 62 mg/dL — ABNORMAL HIGH (ref 6–20)
CO2: 12 mmol/L — ABNORMAL LOW (ref 22–32)
CREATININE: 2.55 mg/dL — AB (ref 0.44–1.00)
Calcium: 8.8 mg/dL — ABNORMAL LOW (ref 8.9–10.3)
Chloride: 118 mmol/L — ABNORMAL HIGH (ref 101–111)
GFR, EST AFRICAN AMERICAN: 19 mL/min — AB (ref >60)
GFR, EST NON AFRICAN AMERICAN: 16 mL/min — AB (ref >60)
Glucose, Bld: 125 mg/dL — ABNORMAL HIGH (ref 65–99)
Potassium: 4.5 mmol/L (ref 3.5–5.1)
Sodium: 142 mmol/L (ref 135–145)

## 2015-05-20 LAB — URINALYSIS, ROUTINE W REFLEX MICROSCOPIC
GLUCOSE, UA: NEGATIVE mg/dL
Ketones, ur: NEGATIVE mg/dL
Nitrite: NEGATIVE
PH: 5 (ref 5.0–8.0)
Protein, ur: 100 mg/dL — AB
SPECIFIC GRAVITY, URINE: 1.022 (ref 1.005–1.030)
Urobilinogen, UA: 1 mg/dL (ref 0.0–1.0)

## 2015-05-20 LAB — URINE MICROSCOPIC-ADD ON

## 2015-05-20 LAB — CREATININE, URINE, RANDOM: CREATININE, URINE: 199.35 mg/dL

## 2015-05-20 LAB — SODIUM, URINE, RANDOM: Sodium, Ur: <10 mmol/L

## 2015-05-20 MED ORDER — HYDROCODONE-ACETAMINOPHEN 5-325 MG PO TABS: 1.0000 | ORAL_TABLET | Freq: Three times a day (TID) | ORAL | Status: DC | PRN

## 2015-05-20 MED ORDER — HYDROCODONE-ACETAMINOPHEN 5-325 MG PO TABS: 1.0000 | ORAL_TABLET | ORAL | Status: AC | PRN

## 2015-05-20 MED ORDER — SODIUM BICARBONATE 650 MG PO TABS: 650.0000 mg | ORAL_TABLET | Freq: Two times a day (BID) | ORAL | Status: AC

## 2015-05-20 NOTE — Progress Notes (Signed)
Pt co neck and back pain.  Tylenol given at 1351, paged Dr. Candiss Norse may she have something else for pain.  T/o lortab 5/325 q8hr prn, use as last resort.

## 2015-05-20 NOTE — Progress Notes (Signed)
Pt converted back to SR pacing 60 per central telemetry

## 2015-05-20 NOTE — Discharge Summary (Addendum)
Chelsey Campbell, is a 81 y.o. female  DOB 1928-07-13  MRN [id number].  Admission date:  05/15/2015  Admitting Physician  Nita Sells, MD  Discharge Date:  05/22/2015   Primary MD  [name], MD  Recommendations for primary care physician for things to follow:   Monitor renal function with BMP closely   Admission Diagnosis  Laceration [T14.8] Acute kidney injury [N17.9]   Discharge Diagnosis  Laceration [T14.8] Acute kidney injury [N17.9]    Principal Problem:   AKI (acute kidney injury) Active Problems:   Peripheral vascular disease   Acute kidney injury   S/P cardiac pacemaker procedure   Atrial fibrillation   S/P aortic valve repair      Past Medical History  Diagnosis Date  . Hypertension   . Hyperlipidemia   . Insomnia   . Overweight(278.02)   . Vaginal prolapse     with pesary  . S/P cardiac pacemaker procedure November 2011    for tachy/brady syndrome  . Peripheral vascular disease     S/P  femoral bypass  . History of TIA (transient ischemic attack) 2011    TIA episode without an actual TIA   . Bursitis     in joints - hips/neck  . Aortic stenosis, severe   . Ventricular dysfunction     Left mild with EF 45-50%  . CHF (congestive heart failure)   . COPD (chronic obstructive pulmonary disease)   . SVD (spontaneous vaginal delivery)     x 3  . Renal insufficiency     borderline - r/t bp - Dr Mercy Moore at [facility]  . Pacemaker     [employer]  . Shortness of breath     occasionally with exertion  . Heart murmur   . GERD (gastroesophageal reflux disease)   . Anemia     history  . CAD (coronary artery disease)   . Atrial fibrillation   . Borderline diabetes   . Arthritis     Past Surgical History  Procedure Laterality Date  . Eye surgery     Bilateral Cataracts  . Coronary artery bypass graft  2013    replaced aortic valve  . Pad  2009    vein right arm to left leg   . Wrist surgery  2005    fell broke both wrists and had reconstrictive surgery on both wrists  . Cardiac valve replacement  2013    aorta replacement w/ stent  . Insert / replace / remove pacemaker  2011    [employer]  . Laparoscopic assisted vaginal hysterectomy N/A 01/12/2013    Procedure: LAPAROSCOPIC ASSISTED VAGINAL HYSTERECTOMY;  Surgeon: Maeola Sarah. Landry Mellow, MD;  Location: [facility] [facility];  Service: Gynecology;  Laterality: N/A;  . Salpingoophorectomy Bilateral 01/12/2013    Procedure: SALPINGO OOPHORECTOMY;  Surgeon: Maeola Sarah. Landry Mellow, MD;  Location: [facility];  Service: Gynecology;  Laterality: Bilateral;  . Cystocele repair N/A 01/12/2013    Procedure: ANTERIOR REPAIR (CYSTOCELE);  Surgeon: Maeola Sarah. Landry Mellow, MD;  Location: [facility];  Service: Gynecology;  Laterality: N/A;  . Cystoscopy N/A 01/12/2013    Procedure: CYSTOSCOPY;  Surgeon: Maeola Sarah. Landry Mellow, MD;  Location: [facility];  Service: Gynecology;  Laterality: N/A;  . Abdominal hysterectomy  01-12-13  . Orif ulnar fracture Left 05/05/2015    Procedure: OPEN REDUCTION INTERNAL FIXATION (ORIF) ULNAR FRACTURE;  Surgeon: Roseanne Kaufman, MD;  Location: [place];  Service: Orthopedics;  Laterality: Left;       HPI  from the history and physical done on the day of admission:     81 y/o ? s/p s/p AVR 23 mm [employer] Freestlye Aortic Bioprosthesis 02/13/12, s/p BMS 12/2011, s/p PPM [employer] 10/14/10 for tachybradycardia syndrome, s/p Lext Arterial bypass 2009, last ECHO 03/20/15 at [facility] EF 60%. She has had multiple recent falls at home for the past 2-3 months. She seems completely dependent on ADLs and has lived with her daughter for the past 4 years in [place]. Over the same period time she has had progressive memory loss and fluctuating mental status She has an almost blind right eye and sees poorly out of the left eye She recently was  admitted to [facility] under the care of Dr. Amedeo Plenty as she had a fall and sustained a left olecranon + radial fracture-she was discharged home with pain medications and Bactrim for an abrasion noted on her elbow  She represented to [facility] 05/15/15 with accidental fall and injury to the head which was repaired by the emergency room. She has abrasions over her arms She is slightly confused and some of the history is taken from her daughter who gives most of it freely  Daughter states that she has been confused and somewhat agitated. Her baseline is that moves slowly and uses an assistive device  She has not had any recent fever or chills or any other issues, no dark stool no tarry stool no chest pain no nausea no vomiting recently. She has been drinking very little according to the daughter only one 12 ounce bottle day before yesterday and yesterday and has been constipated and was given Senokot because she is on pain medications for recent hand fracture. She states to me herself the patient that she is drinking a lot whereas the daughter says the opposite    Hospital Course:     1. ARF on CKD 3 - baseline creat 1.7, mild improvement with hydration, stable renal ultrasound, avoid nephrotoxic Meds. Per daughter BMP was checked a few months ago by PCP and creatinine was again close to 1.7. Renal function has finally improved after adequate hydration, Have added oral bicarbonate for renal failure associated metabolic acidosis along with hyperchloremia. Poor candidate for dialysis. Request SNF M.D. or primary M.D. to repeat BMP in a week.   2. Hyperkalemia due to #1 - given Kayexalate on 05/18/2015 along with bicarbonate orally , potassium levels are now stable.   3. Multiple falls. PT eval will require placement. She has pacemaker placed.   4. PAD. Continue 81 mg aspirin.   5. Moderate dementia. Outpatient follow with PCP and if needed neurology.   6. GERD. On  PPI.   7. Dyslipidemia. Continue home dose statin   8. Recent left olecranon and radial fracture from fall. Follow with Dr. Amedeo Plenty and post discharge. Arm under cast.Dr. Amedeo Plenty following. Has some mottling in the left hand fingers.    9. Incidental finding of carotid artery stenosis and questionable right-sided thyroid calcification. Outpatient age-appropriate followed by PCP. Does not  appear to be a candidate for carotid artery endarterectomy etc. On aspirin and statin continue for secondary prevention. Thyroid ultrasound appears stable.    10. HTN - meds as below, monitor and adjust as needed.     Discharge Condition:  Stable  Follow UP  Follow-up Information    Follow up with [name], MD. Schedule an appointment as soon as possible for a visit in 1 week.   Specialty:  Specialist   Contact information:   [place] [address] [phone]       Follow up with Paulene Floor, MD. Schedule an appointment as soon as possible for a visit in 1 week.   Specialty:  Orthopedic Surgery   Contact information:   [address] [place] [address] [phone]        Consults obtained - Renal, Hand Surg  Diet and Activity recommendation: See Discharge Instructions below  Discharge Instructions           Discharge Instructions    Discharge instructions    Complete by:  As directed   Follow with Primary MD [name], MD in 7 days , follow your CT results for Carotid and Thyroid findings  Get CBC, CMP, 2 view Chest X ray checked  by Primary MD next visit.    Activity: As tolerated with Full fall precautions use walker/cane & assistance as needed   Disposition SNF   Diet: Heart Healthy  with feeding assistance and aspiration precautions.  For Heart failure patients - Check your Weight same time everyday, if you gain over 2 pounds, or you develop in leg swelling, experience more shortness of breath or chest  pain, call your Primary MD immediately. Follow Cardiac Low Salt Diet and 1.5 lit/day fluid restriction.   On your next visit with your primary care physician please Get Medicines reviewed and adjusted.   Please request your Prim.MD to go over all Hospital Tests and Procedure/Radiological results at the follow up, please get all Hospital records sent to your Prim MD by signing hospital release before you go home.   If you experience worsening of your admission symptoms, develop shortness of breath, life threatening emergency, suicidal or homicidal thoughts you must seek medical attention immediately by calling 911 or calling your MD immediately  if symptoms less severe.  You Must read complete instructions/literature along with all the possible adverse reactions/side effects for all the Medicines you take and that have been prescribed to you. Take any new Medicines after you have completely understood and accpet all the possible adverse reactions/side effects.   Do not drive, operating heavy machinery, perform activities at heights, swimming or participation in water activities or provide baby sitting services if your were admitted for syncope or siezures until you have seen by Primary MD or a Neurologist and advised to do so again.  Do not drive when taking Pain medications.    Do not take more than prescribed Pain, Sleep and Anxiety Medications  Special Instructions: If you have smoked or chewed Tobacco  in the last 2 yrs please stop smoking, stop any regular Alcohol  and or any Recreational drug use.  Wear Seat belts while driving.   Please note  You were cared for by a hospitalist during your hospital stay. If you have any questions about your discharge medications or the care you received while you were in the hospital after you are discharged, you can call the unit and asked to speak  with the hospitalist on call if the hospitalist that took care of you is not available. Once you are  discharged, your primary care physician will handle any further medical issues. Please note that NO REFILLS for any discharge medications will be authorized once you are discharged, as it is imperative that you return to your primary care physician (or establish a relationship with a primary care physician if you do not have one) for your aftercare needs so that they can reassess your need for medications and monitor your lab values.     Increase activity slowly    Complete by:  As directed              Discharge Medications       Medication List    STOP taking these medications        losartan-hydrochlorothiazide 100-25 MG per tablet  Commonly known as:  HYZAAR     sulfamethoxazole-trimethoprim 800-160 MG per tablet  Commonly known as:  BACTRIM DS,SEPTRA DS      TAKE these medications        acetaminophen 500 MG tablet  Commonly known as:  TYLENOL  Take 500 mg by mouth every 6 (six) hours as needed (pain).     albuterol 108 (90 BASE) MCG/ACT inhaler  Commonly known as:  PROVENTIL HFA;VENTOLIN HFA  Inhale 1-2 puffs into the lungs 2 (two) times daily. Inhale 1 puff daily at bedtime, may also use once during the day as needed for shortness of breath     amLODipine 10 MG tablet  Commonly known as:  NORVASC  Take 1 tablet (10 mg total) by mouth daily.     aspirin EC 81 MG tablet  Take 81 mg by mouth at bedtime.     HYDROcodone-acetaminophen 5-325 MG per tablet  Commonly known as:  NORCO/VICODIN  Take 1 tablet by mouth every 4 (four) hours as needed (pain).     INCRUSE ELLIPTA 62.5 MCG/INH Aepb  Generic drug:  Umeclidinium Bromide  Inhale 1 puff into the lungs daily.     metoprolol 100 MG tablet  Commonly known as:  LOPRESSOR  Take 100 mg by mouth 2 (two) times daily.     multivitamin with minerals Tabs tablet  Take 1 tablet by mouth daily. Centrum Silver     nortriptyline 10 MG capsule  Commonly known as:  PAMELOR  Take 10 mg by mouth at bedtime.     omeprazole  20 MG tablet  Commonly known as:  PRILOSEC OTC  Take 20 mg by mouth daily.     SENNA PLUS 8.6-50 MG per tablet  Generic drug:  senna-docusate  Take 1 tablet by mouth at bedtime.     simvastatin 20 MG tablet  Commonly known as:  ZOCOR  Take 20 mg by mouth at bedtime.     sodium bicarbonate 650 MG tablet  Take 1 tablet (650 mg total) by mouth 2 (two) times daily.     SUPER B COMPLEX/VITAMIN C Tabs  Take 1 tablet by mouth daily.        Major procedures and Radiology Reports - PLEASE review detailed and final reports for all details, in brief -       Dg Chest 2 View  05/05/2015   CLINICAL DATA:  Shortness of breath.  EXAM: CHEST  2 VIEW  COMPARISON:  January 12, 2013.  FINDINGS: Stable cardiomediastinal silhouette. Status post cardiac valve repair. Left sided pacemaker is unchanged in position. No pneumothorax or pleural effusion is  noted. No acute pulmonary disease is noted.  IMPRESSION: No active cardiopulmonary disease.   Electronically Signed   By: Marijo Conception, M.D.   On: 05/05/2015 10:41   Dg Forearm Left  05/15/2015   CLINICAL DATA:  Pain status post fall.  EXAM: LEFT FOREARM - 2 VIEW  COMPARISON:  05/03/2015  FINDINGS: There has been an interval plate and screw fixation of the previously demonstrated proximal ulnar fracture, and distal radial fracture. There is some callus formation. Comminution of the olecranon is noted. There is decreased soft tissue swelling. There is no evidence of new acute fracture or dislocation. Cast material obscures detailed evaluation of the soft tissues.  IMPRESSION: Status post plate and screw fixation of proximal ulnar and distal radial fractures, with evidence of minimal healing.  No evidence of new acute fracture or subluxation.   Electronically Signed   By: Fidela Salisbury M.D.   On: 05/15/2015 15:02   Dg Wrist Complete Right  05/15/2015   CLINICAL DATA:  Pain following fall  EXAM: RIGHT WRIST - COMPLETE 3+ VIEW  COMPARISON:  None.   FINDINGS: Frontal, oblique, lateral, and ulnar deviation scaphoid images were obtained. There is postoperative change in the distal radius with screw and plate fixation in this area. There is chronic avulsion of the ulnar styloid with a bony fragment noted in the triangular fibrocartilage. There is no acute fracture or dislocation. There is moderate narrowing of the radiocarpal joint as well as the scaphotrapezial joint. No erosive change.  IMPRESSION: Evidence of old trauma with remodeling in the distal radius and chronic avulsion of the ulnar styloid. Areas of osteoarthritic change. No acute fracture or dislocation.   Electronically Signed   By: Lowella Grip III M.D.   On: 05/15/2015 14:56   Ct Head Wo Contrast  05/15/2015   CLINICAL DATA:  Patient fell hitting back of head. Several recent syncopal episodes  EXAM: CT HEAD WITHOUT CONTRAST  CT CERVICAL SPINE WITHOUT CONTRAST  TECHNIQUE: Multidetector CT imaging of the head and cervical spine was performed following the standard protocol without intravenous contrast. Multiplanar CT image reconstructions of the cervical spine were also generated.  COMPARISON:  None.  FINDINGS: CT HEAD FINDINGS  There is moderate diffuse atrophy. There is no intracranial mass, hemorrhage, extra-axial fluid collection, or midline shift. There is patchy small vessel disease in the centra semiovale bilaterally. Elsewhere gray-white compartments appear normal. There is no acute infarct. The bony calvarium appears intact. The mastoid air cells are clear.  CT CERVICAL SPINE FINDINGS  There is no demonstrable fracture. There is slight anterolisthesis of C3 on C4, felt to be due to underlying spondylosis. There is no other appreciable spondylolisthesis. Prevertebral soft tissues and predental space regions are normal. There is moderately severe disc space narrowing at C5-6 and C6-7. There is moderate narrowing at C3-4 and C4-5. There is facet hypertrophy at essentially all levels  bilaterally. There is no disc extrusion or stenosis.  There is calcification in both carotid arteries. There is a focal area of calcification in the right lobe of the thyroid. Visualized lung apices show mild scarring.  IMPRESSION: CT head: Atrophy with periventricular small vessel disease. No intracranial mass, hemorrhage, or acute appearing infarct. No extra-axial fluid collections are identified.  CT cervical spine: No demonstrable fracture. Slight spondylolisthesis at C3-4 is felt to be due to underlying spondylosis. There is multilevel osteoarthritic change. No disc extrusion or stenosis appreciable.  There is carotid artery calcification bilaterally. There appears to be fairly significant  narrowing at the sites of calcification, particularly on the left. Duplex carotid ultrasound may be helpful for further assessment in this regard. There is a small calcification in the right lobe of the thyroid of questionable significance.   Electronically Signed   By: Lowella Grip III M.D.   On: 05/15/2015 15:35   Ct Cervical Spine Wo Contrast  05/15/2015   CLINICAL DATA:  Patient fell hitting back of head. Several recent syncopal episodes  EXAM: CT HEAD WITHOUT CONTRAST  CT CERVICAL SPINE WITHOUT CONTRAST  TECHNIQUE: Multidetector CT imaging of the head and cervical spine was performed following the standard protocol without intravenous contrast. Multiplanar CT image reconstructions of the cervical spine were also generated.  COMPARISON:  None.  FINDINGS: CT HEAD FINDINGS  There is moderate diffuse atrophy. There is no intracranial mass, hemorrhage, extra-axial fluid collection, or midline shift. There is patchy small vessel disease in the centra semiovale bilaterally. Elsewhere gray-white compartments appear normal. There is no acute infarct. The bony calvarium appears intact. The mastoid air cells are clear.  CT CERVICAL SPINE FINDINGS  There is no demonstrable fracture. There is slight anterolisthesis of C3 on  C4, felt to be due to underlying spondylosis. There is no other appreciable spondylolisthesis. Prevertebral soft tissues and predental space regions are normal. There is moderately severe disc space narrowing at C5-6 and C6-7. There is moderate narrowing at C3-4 and C4-5. There is facet hypertrophy at essentially all levels bilaterally. There is no disc extrusion or stenosis.  There is calcification in both carotid arteries. There is a focal area of calcification in the right lobe of the thyroid. Visualized lung apices show mild scarring.  IMPRESSION: CT head: Atrophy with periventricular small vessel disease. No intracranial mass, hemorrhage, or acute appearing infarct. No extra-axial fluid collections are identified.  CT cervical spine: No demonstrable fracture. Slight spondylolisthesis at C3-4 is felt to be due to underlying spondylosis. There is multilevel osteoarthritic change. No disc extrusion or stenosis appreciable.  There is carotid artery calcification bilaterally. There appears to be fairly significant narrowing at the sites of calcification, particularly on the left. Duplex carotid ultrasound may be helpful for further assessment in this regard. There is a small calcification in the right lobe of the thyroid of questionable significance.   Electronically Signed   By: Lowella Grip III M.D.   On: 05/15/2015 15:35   US Soft Tissue Head/neck  05/17/2015   CLINICAL DATA:  Right thyroid nodule identified by cervical CT.  EXAM: THYROID ULTRASOUND  TECHNIQUE: Ultrasound examination of the thyroid gland and adjacent soft tissues was performed.  COMPARISON:  CT of the cervical spine on 05/15/2015  FINDINGS: Right thyroid lobe  Measurements: 3.7 x 1.7 x 1.8 cm. Solid nodule in the mid to lower aspect of the right lobe measures approximately 1.4 x 1.3 x 1.2 cm. Internal macroscopic shadowing calcifications present within this nodule.  Left thyroid lobe  Measurements: 3.7 x 1.6 x 1.4 cm.  No nodules  visualized.  Isthmus  Thickness: 0.3 cm.  No nodules visualized.  Lymphadenopathy  None visualized.  Incidental calcified plaque identified in the right common carotid artery.  IMPRESSION: 1. Solitary right thyroid nodule measuring 1.4 cm. Macroscopic calcification present which is more associated with benign nodules. The nodule does not meet size criteria for biopsy. Ultrasound surveillance is recommended in 12 months. 2. Carotid atherosclerosis with calcified plaque identified in the right common carotid artery.   Electronically Signed   By: Jenness Corner.D.  On: 05/17/2015 07:43   US Renal  05/17/2015   CLINICAL DATA:  Acute renal failure.  EXAM: RENAL / URINARY TRACT ULTRASOUND COMPLETE  COMPARISON:  None.  FINDINGS: Right Kidney:  Length: 8.7 cm. Diffuse cortical thinning. No hydronephrosis. 2.5 cm x 1.3 cm x 2 cm exophytic cyst arises from the upper pole. 1.4 cm x 0.8 cm x 1.0 cm exophytic cyst arises from the lower pole. No other masses.  Left Kidney:  Length: 11.3 cm. Mild cortical thinning. Normal parenchymal echogenicity. No mass or cyst. No stones. No hydronephrosis.  Bladder:  Appears normal for degree of bladder distention.  IMPRESSION: 1. No acute finding.  No hydronephrosis. 2. Bilateral cortical thinning, right greater than left. Smaller right kidney. Two right renal cysts.   Electronically Signed   By: Lajean Manes M.D.   On: 05/17/2015 12:45   Dg Chest Port 1 View  05/16/2015   CLINICAL DATA:  Shortness of breath.  EXAM: PORTABLE CHEST - 1 VIEW  COMPARISON:  05/05/2015.  FINDINGS: Trachea is midline. Heart size stable. Pacemaker lead tips project over the right atrium and right ventricle. Lungs are clear. No pleural fluid.  IMPRESSION: No acute findings.   Electronically Signed   By: Lorin Picket M.D.   On: 05/16/2015 14:40   Dg Humerus Left  05/15/2015   CLINICAL DATA:  81 year old female with pain and tenderness in the left upper extremity.  EXAM: LEFT HUMERUS - 2+ VIEW   COMPARISON:  Radiograph 05/03/2015  FINDINGS: Evaluation is limited due to superimposed dressing as well as absence of true lateral projection. There is internal fixation of the previously seen olecranon fracture with plate and screw. There is irregularity of the radial head compatible with previously seen fracture. The radial-capitellar alignment is preserved on the frontal projection. No definite new fracture identified. Evaluation of the distal humerus is limited on the lateral projection.  A left pectoral pacemaker device is partially visualized.  IMPRESSION: Interval placement of fixation plate and screw through the previously seen olecranon fracture. No new fracture identified.   Electronically Signed   By: Anner Crete M.D.   On: 05/15/2015 15:00   Dg Hand Complete Right  05/15/2015   CLINICAL DATA:  Fall.  Pain and tenderness in the right hand.  EXAM: RIGHT HAND - COMPLETE 3+ VIEW  COMPARISON:  05/15/2015 wrist radiographs  FINDINGS: Osteoarthritis noted with interphalangeal articular space narrowing. Spurring at the interphalangeal joint of the thumb.  Distal radial plate and screw fixator. Old nonunited fracture of the ulnar styloid.  Bony demineralization.  IMPRESSION: 1. 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 No compelling findings of acute bony fracture. 2. Bony demineralization.   Electronically Signed   By: Van Clines M.D.   On: 05/15/2015 14:54    Micro Results      Recent Results (from the past 240 hour(s))  Urine culture     Status: None   Collection Time: 05/20/15 12:32 AM  Result Value Ref Range Status   Specimen Description URINE, CLEAN CATCH  Final   Special Requests NONE  Final   Culture   Final    MULTIPLE SPECIES PRESENT, SUGGEST RECOLLECTION IF CLINICALLY INDICATED   Report Status 05/21/2015 FINAL  Final       Today   Subjective    Chelsey Campbell today has no headache,no chest abdominal pain,no new weakness tingling or numbness, feels much better wants to go  home today.     Objective   Blood pressure 161/79, pulse 59, temperature  98.5 F (36.9 C), temperature source Oral, resp. rate 19, height 4\' 11"  (1.499 m), weight 73 kg (160 lb 15 oz), SpO2 100 %.   Intake/Output Summary (Last 24 hours) at 05/22/15 1004 Last data filed at 05/22/15 0614  Gross per 24 hour  Intake    483 ml  Output    200 ml  Net    283 ml    Exam Awake, pleasantly confused, No new F.N deficits, Normal affect [place].AT,PERRAL Supple Neck,No JVD, No cervical lymphadenopathy appriciated.  Symmetrical Chest wall movement, Good air movement bilaterally, CTAB RRR,No Gallops,Rubs or new Murmurs, No Parasternal Heave +ve B.Sounds, Abd Soft, Non tender, No organomegaly appriciated, No rebound -guarding or rigidity. No Cyanosis, Clubbing or edema, No new Rash or bruise, L arm in splint   Data Review   CBC w Diff:  Lab Results  Component Value Date   WBC 8.9 05/16/2015   HGB 10.1* 05/16/2015   HCT 29.8* 05/16/2015   PLT 172 05/16/2015   LYMPHOPCT 7* 05/15/2015   MONOPCT 2* 05/15/2015   EOSPCT 1 05/15/2015   BASOPCT 0 05/15/2015    CMP:  Lab Results  Component Value Date   NA 142 05/20/2015   K 4.5 05/20/2015   CL 118* 05/20/2015   CO2 12* 05/20/2015   BUN 62* 05/20/2015   CREATININE 2.55* 05/20/2015   PROT 5.7* 05/16/2015   ALBUMIN 2.8* 05/16/2015   BILITOT 0.3 05/16/2015   ALKPHOS 61 05/16/2015   AST 42* 05/16/2015   ALT 46 05/16/2015  .   Total Time in preparing paper work, data evaluation and todays exam - 35 minutes  Thurnell Lose M.D on 05/22/2015 at 10:04 AM  [facility]   Office  [phone]

## 2015-05-20 NOTE — Progress Notes (Signed)
Pt converted back to a-fib @ 2248 last night.  Pt trending to 130's pt to get metorpolol @ 1000, paged Dr. Valeta Harms

## 2015-05-20 NOTE — Progress Notes (Signed)
Assessment/Plan: 1. Acute renal failure on chronic kidney disease stage III: improving  2. Physical deconditioning/recurrent falls with recent left comminuted/compound elbow fracture status post ORIF, for SNF 3. Anemia:s/p IV iron 4. Metabolic acidosis, hyperchloremic, worse for unclear reasons: cont oral sodium bicarbonate  Subjective: Interval History: Feels better  Objective: Vital signs in last 24 hours: Temp:  [97.7 F (36.5 C)-98.6 F (37 C)] 97.8 F (36.6 C) (07/03 0802) Pulse Rate:  [57-66] 58 (07/03 0802) Resp:  [17-20] 18 (07/03 0802) BP: (132-177)/(50-94) 132/77 mmHg (07/03 0802) SpO2:  [96 %-100 %] 98 % (07/03 0802) Weight change:   Intake/Output from previous day: 07/02 0701 - 07/03 0700 In: 2028.8 [P.O.:360; I.V.:1668.8] Out: 50 [Urine:50] Intake/Output this shift: Total I/O In: 120 [P.O.:120] Out: -   General appearance: alert and cooperative Resp: wheezes bilaterally Cardio: regular rate and rhythm, S1, S2 normal, no murmur, click, rub or gallop Extremities: edema 1+  Lab Results: No results for input(s): WBC, HGB, HCT, PLT in the last 72 hours. BMET:  Recent Labs  05/19/15 0426 05/20/15 0552  NA 139 142  K 4.4 4.5  CL 113* 118*  CO2 15* 12*  GLUCOSE 155* 125*  BUN 62* 62*  CREATININE 2.97* 2.55*  CALCIUM 8.5* 8.8*   No results for input(s): PTH in the last 72 hours. Iron Studies:  Recent Labs  05/17/15 1540  IRON 19*  TIBC 200*  FERRITIN 913*   Studies/Results: No results found.  Scheduled: . aspirin EC  81 mg Oral QHS  . docusate sodium  200 mg Oral BID  . heparin  5,000 Units Subcutaneous 3 times per day  . metoprolol  75 mg Oral BID  . nortriptyline  10 mg Oral QHS  . senna-docusate  1 tablet Oral QHS  . sodium bicarbonate  650 mg Oral TID  . sodium chloride  3 mL Intravenous Q12H  . Umeclidinium Bromide  1 puff Inhalation Daily     LOS: 4 days   Suki Crockett C 05/20/2015,9:59 AM

## 2015-05-20 NOTE — Progress Notes (Signed)
Patient ID: Chelsey Campbell, female   DOB: [birth year], 81 y.o.   MRN: [id number] Patient seen at bedside she's looking well P  I've given her daughter my cell phone number so we can see her next week to remove the remaining sutures.  She's delights he is always. She remains neurovascular intact is no signs of infection or other problems at this juncture with her extremity.  Carnelius Hammitt M.D.

## 2015-05-20 NOTE — Progress Notes (Signed)
Janett Billow at [facility] has been trying to get in touch with administrator all morning about possible admit for today- she reports they were not advised of this as of Friday and thus are likely not able to take patient today. CSW has asked her to continue to attempt to reach admin for hopeful admit today- MD has been advised of this barrier-  Eduard Clos, MSW, [place]

## 2015-05-20 NOTE — Progress Notes (Signed)
Dr. Candiss Norse give metoprolol as ordered, no new orders at this time.

## 2015-05-21 LAB — URINE CULTURE

## 2015-05-21 LAB — LACTIC ACID, PLASMA
LACTIC ACID, VENOUS: 0.8 mmol/L (ref 0.5–2.0)
Lactic Acid, Venous: 1 mmol/L (ref 0.5–2.0)

## 2015-05-21 LAB — CK: Total CK: 31 U/L — ABNORMAL LOW (ref 38–234)

## 2015-05-21 MED ORDER — AMLODIPINE BESYLATE 10 MG PO TABS: 10.0000 mg | ORAL_TABLET | Freq: Every day | ORAL | Status: AC

## 2015-05-21 MED ORDER — AMLODIPINE BESYLATE 10 MG PO TABS
10.0000 mg | ORAL_TABLET | Freq: Every day | ORAL | Status: DC
  Administered 2015-05-21 – 2015-05-22 (×2): 10 mg via ORAL
  Filled 2015-05-21 (×2): qty 1

## 2015-05-21 MED ORDER — SODIUM BICARBONATE 650 MG PO TABS
1300.0000 mg | ORAL_TABLET | Freq: Three times a day (TID) | ORAL | Status: DC
  Administered 2015-05-21 – 2015-05-22 (×3): 1300 mg via ORAL
  Filled 2015-05-21 (×4): qty 2

## 2015-05-21 NOTE — Progress Notes (Signed)
Called to room d/t "patient bleeding." Noted small pin drops of blood on abdomen around sites of bruising from heparin injections. MD notified. Heparin discontinued. SCDs ordered. Pt cleaned up. Will continue to monitor. Bartholomew Crews, RN

## 2015-05-21 NOTE — Care Management Note (Signed)
Case Management Note  Patient Details  Name: ARELI FRARY MRN: [id number] Date of Birth: 29-Jan-1928  Subjective/Objective:                    Action/Plan:   Expected Discharge Date:                  Expected Discharge Plan:  Skilled Nursing Facility  In-House Referral:  NA, Clinical Social Work  Discharge planning Services  CM Consult  Post Acute Care Choice:  NA Choice offered to:  Patient, Adult Children  DME Arranged:    DME Agency:     HH Arranged:    [place] Agency:     Status of Service:  Completed, signed off  Medicare Important Message Given:    Date Medicare IM Given:    Medicare IM give by:    Date Additional Medicare IM Given:   05/21/15 Additional Medicare Important Message give by:   Norina Buzzard, RN, CM  If discussed at Long Length of Stay Meetings, dates discussed:    Additional Comments:  Norina Buzzard, RN 05/21/2015, 10:38 AM

## 2015-05-21 NOTE — Progress Notes (Signed)
Physical Therapy Treatment Patient Details Name: Chelsey Campbell MRN: [id number] DOB: [birth year] Today's Date: 05/21/2015    History of Present Illness Pt is a 81 y.o.F s/p ORIF left ulnar fracture.Pt's PMH includes HTN, PVD, TIA, CHF, COPD, pacemaker, anemia, arthritis, and wrist surgery.  Admitted 6/28 again with fall.  AKI as well.      PT Comments    Pt doing well but with c/o shoulder pain; she reports getting on/off bed pan has been very difficult and painful today; planning for D/C to [facility] tomorrow;   Follow Up Recommendations  SNF;Supervision/Assistance - 24 hour     Equipment Recommendations  None recommended by PT    Recommendations for Other Services       Precautions / Restrictions Precautions Precautions: Fall Precaution Comments: no pushing, pulling, lifting with LUE (can use to hold light items) Restrictions LUE Weight Bearing: Non weight bearing    Mobility  Bed Mobility               General bed mobility comments: not attempted d/t pt states she is hurting too much today and +2 assist unavailable  Transfers                    Ambulation/Gait                 Stairs            Wheelchair Mobility    Modified Rankin (Stroke Patients Only)       Balance                                    Cognition Arousal/Alertness: Awake/alert Behavior During Therapy: WFL for tasks assessed/performed Overall Cognitive Status: History of cognitive impairments - at baseline       Memory: Decreased short-term memory       Problem Solving: Slow processing;Requires verbal cues General Comments: pt initially could not remember her dog's name, although she talked about her a lot    Exercises General Exercises - Upper Extremity Shoulder Flexion: AAROM;Both;15 reps;Supine Digit Composite Flexion: AROM;Left;10 reps Composite Extension: AROM;Left;10 reps General Exercises - Lower Extremity Ankle Circles/Pumps:  AROM;Both;15 reps;Supine Quad Sets: AROM;Both;15 reps;Supine Gluteal Sets: AROM;Both;15 reps;Supine Heel Slides: AROM;Strengthening;Both;15 reps;Supine (manual resistance for extension) Hip ABduction/ADduction: AAROM;AROM;Strengthening;Both;15 reps;Supine    General Comments        Pertinent Vitals/Pain Pain Assessment: Faces Faces Pain Scale: Hurts a little bit Pain Location: right shoulder Pain Descriptors / Indicators: Sore Pain Intervention(s): Limited activity within patient's tolerance;Monitored during session    Home Living                      Prior Function            PT Goals (current goals can now be found in the care plan section) Acute Rehab PT Goals Patient Stated Goal: to get better, walk again and  see her little dog PT Goal Formulation: With patient Time For Goal Achievement: 05/30/15 Potential to Achieve Goals: Good Progress towards PT goals: Progressing toward goals    Frequency  Min 2X/week    PT Plan Current plan remains appropriate    Co-evaluation             End of Session   Activity Tolerance: Patient limited by pain Patient left: in bed;with call bell/phone within reach;with bed alarm set     Time:  2229-7989 PT Time Calculation (min) (ACUTE ONLY): 15 min  Charges:  $Therapeutic Exercise: 8-22 mins                    G Codes:      Burnette Sautter 06-01-2015, 1:33 PM

## 2015-05-21 NOTE — Clinical Social Work Note (Signed)
Call made to [facility] to talk with admission's staff  Regarding patient discharge. CSW informed that admissions staff nor Administrator is working today. MD advised.  Deloyd Handy Givens, MSW, LCSW Licensed Clinical Social Worker [place] [phone]

## 2015-05-21 NOTE — Progress Notes (Signed)
Patient ID: Chelsey Campbell, female   DOB: June 05, 1928, 81 y.o.   MRN: [id number]  [facility] Progress Note   Assessment/ Plan:   1. ARF on CKD stage III: improving renal function with improved hemodynamic state. No uremic symptoms- will set up OP f/u upon DC 2. Deconditioning/recurrent falls: to be admitted to SNF for ongoing PT/rehab 3. Anemia: Hgb stable without overt loss- monitor trend 4. Metabolic acidosis: increased sodium bicarbonate dose- no GI losses noted, check lactate and CPK   Subjective:   Reports improvement of neck/head pain after getting lortab--somewhat somnolent   Objective:   BP 142/52 mmHg  Pulse 60  Temp(Src) 98.5 F (36.9 C) (Oral)  Resp 18  Ht 4\' 11"  (1.499 m)  Wt 70.8 kg (156 lb 1.4 oz)  BMI 31.51 kg/m2  SpO2 99%  Intake/Output Summary (Last 24 hours) at 05/21/15 1015 Last data filed at 05/21/15 0827  Gross per 24 hour  Intake   1200 ml  Output    100 ml  Net   1100 ml   Weight change:   Physical Exam: QBH:ALPFXTKWIOX sleeping in bed--awakens to voice BDZ:HGDJM RRR, normal s1 and s2 Resp:CTA bilaterally, no rales EQA:STMH, obese, NT, BS normal Ext:1+ LE edema  Imaging: No results found.  Labs: BMET  Recent Labs Lab 05/15/15 1450 05/15/15 1907 05/16/15 0525 05/17/15 0540 05/18/15 0530 05/19/15 0426 05/20/15 0552  NA 130*  --  132* 133* 135 139 142  K 5.5*  --  4.7 4.7 5.7* 4.4 4.5  CL 104  --  105 110 111 113* 118*  CO2  --   --  16* 17* 15* 15* 12*  GLUCOSE 120*  --  117* 89 132* 155* 125*  BUN 44*  --  41* 51* 62* 62* 62*  CREATININE 2.70* 2.54* 2.78* 2.93* 3.36* 2.97* 2.55*  CALCIUM  --   --  8.3* 8.4* 8.6* 8.5* 8.8*   CBC  Recent Labs Lab 05/15/15 1315 05/15/15 1450 05/15/15 1907 05/16/15 0525  WBC 11.4*  --  9.5 8.9  NEUTROABS 9.8*  --   --   --   HGB 11.6* 12.6 10.3* 10.1*  HCT 34.0* 37.0 30.8* 29.8*  MCV 89.9  --  89.8 92.3  PLT 229  --  193 172    Medications:    . amLODipine  10 mg Oral  Daily  . aspirin EC  81 mg Oral QHS  . docusate sodium  200 mg Oral BID  . heparin  5,000 Units Subcutaneous 3 times per day  . metoprolol  75 mg Oral BID  . nortriptyline  10 mg Oral QHS  . senna-docusate  1 tablet Oral QHS  . sodium bicarbonate  1,300 mg Oral TID  . sodium chloride  3 mL Intravenous Q12H    Elmarie Shiley, MD 05/21/2015, 10:15 AM

## 2015-05-21 NOTE — Progress Notes (Signed)
Patient Demographics:    Chelsey Campbell, is a 81 y.o. female, DOB - 1927-12-17, TAV:[id number]  Admit date - 05/15/2015   Admitting Physician Nita Sells, MD  Outpatient Primary MD for the patient is [name], MD  LOS - 5   Chief Complaint  Patient presents with  . Fall        Subjective:    Chelsey Campbell today has, No headache, No chest pain, No abdominal pain - No Nausea, No new weakness tingling or numbness, No Cough - SOB.     Assessment  & Plan :     1. ARF on CKD 3 - baseline creat 1.7, mild improvement with hydration, stable renal ultrasound, avoid nephrotoxic Meds. Per daughter BMP was checked a few months ago by PCP and creatinine was again close to 1.7.  Renal following, developing some fluid overload so fluid rate reduced along with one-time dose of Lasix on 05/19/2015, Have added oral bicarbonate for renal failure associated metabolic acidosis along with hyperchloremia. Poor candidate for dialysis.   2. Hyperkalemia due to #1 - given Kayexalate on 05/18/2015 along with bicarbonate orally, potassium levels better.   3. Multiple falls. PT eval will require placement. She has pacemaker placed.   4. PAD. Continue 81 mg aspirin.   5. Moderate dementia. Outpatient follow with PCP and if needed neurology.   6. GERD. On PPI.   7. Dyslipidemia. Continue home dose statin   8. Recent left olecranon and radial fracture from fall. Follow with Dr. Amedeo Plenty and post discharge. Arm under cast.Dr. Amedeo Plenty following. Has some mottling in the left hand fingers.    9. Incidental finding of carotid artery stenosis and questionable right-sided thyroid calcification. Outpatient age-appropriate followed by PCP. Does not appear to be a candidate for carotid artery endarterectomy etc. On  aspirin and statin continue for secondary prevention. Thyroid ultrasound appears stable.         Code Status : Full  Family Communication  : daughter and great grand daughter  Disposition Plan  : SNF DC done, await bed  Consults  :  Renal, Hand Surg  Procedures  : CT head and C spine  DVT Prophylaxis  :   Heparin    Lab Results  Component Value Date   PLT 172 05/16/2015    Inpatient Medications  Scheduled Meds: . amLODipine  10 mg Oral Daily  . aspirin EC  81 mg Oral QHS  . docusate sodium  200 mg Oral BID  . heparin  5,000 Units Subcutaneous 3 times per day  . metoprolol  75 mg Oral BID  . nortriptyline  10 mg Oral QHS  . senna-docusate  1 tablet Oral QHS  . sodium bicarbonate  1,300 mg Oral TID  . sodium chloride  3 mL Intravenous Q12H   Continuous Infusions:   PRN Meds:.acetaminophen, albuterol, haloperidol lactate, HYDROcodone-acetaminophen, zolpidem  Antibiotics  :     Anti-infectives    None        Objective:   Filed Vitals:   05/20/15 2100 05/21/15 0500 05/21/15 0533 05/21/15 0827  BP: 155/79 194/82  142/52  Pulse: 68 60  60  Temp: 98.5 F (36.9 C) 98.6 F (37 C)  98.5 F (36.9 C)  TempSrc: Oral Oral  Oral  Resp: 18 20  18   Height:      Weight: 70.8 kg (156 lb 1.4 oz)     SpO2: 100% 99% 90% 99%    Wt Readings from Last 3 Encounters:  05/20/15 70.8 kg (156 lb 1.4 oz)  05/05/15 65.772 kg (145 lb)  04/17/14 66.225 kg (146 lb)     Intake/Output Summary (Last 24 hours) at 05/21/15 1101 Last data filed at 05/21/15 1051  Gross per 24 hour  Intake   1200 ml  Output    250 ml  Net    950 ml     Physical Exam  Awake , Oriented X 1, No new F.N deficits, Normal affect [place].AT,PERRAL Supple Neck,No JVD, No cervical lymphadenopathy appriciated.  Symmetrical Chest wall movement, Good air movement bilaterally, CTAB RRR,No Gallops,Rubs or new Murmurs, No Parasternal Heave +ve B.Sounds, Abd Soft, No tenderness, No organomegaly appriciated, No  rebound - guarding or rigidity. No Cyanosis, Clubbing or edema, No new Rash or bruise   Left arm in splint    Data Review:   Micro Results Recent Results (from the past 240 hour(s))  Urine culture     Status: None (Preliminary result)   Collection Time: 05/20/15 12:32 AM  Result Value Ref Range Status   Specimen Description URINE, CLEAN CATCH  Final   Special Requests NONE  Final   Culture NO GROWTH < 12 HOURS  Final   Report Status PENDING  Incomplete    Radiology Reports Dg Chest 2 View  05/05/2015   CLINICAL DATA:  Shortness of breath.  EXAM: CHEST  2 VIEW  COMPARISON:  January 12, 2013.  FINDINGS: Stable cardiomediastinal silhouette. Status post cardiac valve repair. Left sided pacemaker is unchanged in position. No pneumothorax or pleural effusion is noted. No acute pulmonary disease is noted.  IMPRESSION: No active cardiopulmonary disease.   Electronically Signed   By: Marijo Conception, M.D.   On: 05/05/2015 10:41   Dg Forearm Left  05/15/2015   CLINICAL DATA:  Pain status post fall.  EXAM: LEFT FOREARM - 2 VIEW  COMPARISON:  05/03/2015  FINDINGS: There has been an interval plate and screw fixation of the previously demonstrated proximal ulnar fracture, and distal radial fracture. There is some callus formation. Comminution of the olecranon is noted. There is decreased soft tissue swelling. There is no evidence of new acute fracture or dislocation. Cast material obscures detailed evaluation of the soft tissues.  IMPRESSION: Status post plate and screw fixation of proximal ulnar and distal radial fractures, with evidence of minimal healing.  No evidence of new acute fracture or subluxation.   Electronically Signed   By: Fidela Salisbury M.D.   On: 05/15/2015 15:02   Dg Wrist Complete Right  05/15/2015   CLINICAL DATA:  Pain following fall  EXAM: RIGHT WRIST - COMPLETE 3+ VIEW  COMPARISON:  None.  FINDINGS: Frontal, oblique, lateral, and ulnar deviation scaphoid images were obtained.  There is postoperative change in the distal radius with screw and plate fixation in this area. There is chronic avulsion of the ulnar styloid with a bony fragment noted in the triangular fibrocartilage. There is no acute fracture or dislocation. There is moderate narrowing of the radiocarpal joint as well as the scaphotrapezial joint. No erosive change.  IMPRESSION: Evidence of old trauma with remodeling in the distal radius and chronic avulsion of the ulnar styloid. Areas of osteoarthritic change. No acute fracture or dislocation.   Electronically Signed   By: Gwyndolyn Saxon  Jasmine December III M.D.   On: 05/15/2015 14:56   Ct Head Wo Contrast  05/15/2015   CLINICAL DATA:  Patient fell hitting back of head. Several recent syncopal episodes  EXAM: CT HEAD WITHOUT CONTRAST  CT CERVICAL SPINE WITHOUT CONTRAST  TECHNIQUE: Multidetector CT imaging of the head and cervical spine was performed following the standard protocol without intravenous contrast. Multiplanar CT image reconstructions of the cervical spine were also generated.  COMPARISON:  None.  FINDINGS: CT HEAD FINDINGS  There is moderate diffuse atrophy. There is no intracranial mass, hemorrhage, extra-axial fluid collection, or midline shift. There is patchy small vessel disease in the centra semiovale bilaterally. Elsewhere gray-white compartments appear normal. There is no acute infarct. The bony calvarium appears intact. The mastoid air cells are clear.  CT CERVICAL SPINE FINDINGS  There is no demonstrable fracture. There is slight anterolisthesis of C3 on C4, felt to be due to underlying spondylosis. There is no other appreciable spondylolisthesis. Prevertebral soft tissues and predental space regions are normal. There is moderately severe disc space narrowing at C5-6 and C6-7. There is moderate narrowing at C3-4 and C4-5. There is facet hypertrophy at essentially all levels bilaterally. There is no disc extrusion or stenosis.  There is calcification in both carotid  arteries. There is a focal area of calcification in the right lobe of the thyroid. Visualized lung apices show mild scarring.  IMPRESSION: CT head: Atrophy with periventricular small vessel disease. No intracranial mass, hemorrhage, or acute appearing infarct. No extra-axial fluid collections are identified.  CT cervical spine: No demonstrable fracture. Slight spondylolisthesis at C3-4 is felt to be due to underlying spondylosis. There is multilevel osteoarthritic change. No disc extrusion or stenosis appreciable.  There is carotid artery calcification bilaterally. There appears to be fairly significant narrowing at the sites of calcification, particularly on the left. Duplex carotid ultrasound may be helpful for further assessment in this regard. There is a small calcification in the right lobe of the thyroid of questionable significance.   Electronically Signed   By: Lowella Grip III M.D.   On: 05/15/2015 15:35   Ct Cervical Spine Wo Contrast  05/15/2015   CLINICAL DATA:  Patient fell hitting back of head. Several recent syncopal episodes  EXAM: CT HEAD WITHOUT CONTRAST  CT CERVICAL SPINE WITHOUT CONTRAST  TECHNIQUE: Multidetector CT imaging of the head and cervical spine was performed following the standard protocol without intravenous contrast. Multiplanar CT image reconstructions of the cervical spine were also generated.  COMPARISON:  None.  FINDINGS: CT HEAD FINDINGS  There is moderate diffuse atrophy. There is no intracranial mass, hemorrhage, extra-axial fluid collection, or midline shift. There is patchy small vessel disease in the centra semiovale bilaterally. Elsewhere gray-white compartments appear normal. There is no acute infarct. The bony calvarium appears intact. The mastoid air cells are clear.  CT CERVICAL SPINE FINDINGS  There is no demonstrable fracture. There is slight anterolisthesis of C3 on C4, felt to be due to underlying spondylosis. There is no other appreciable spondylolisthesis.  Prevertebral soft tissues and predental space regions are normal. There is moderately severe disc space narrowing at C5-6 and C6-7. There is moderate narrowing at C3-4 and C4-5. There is facet hypertrophy at essentially all levels bilaterally. There is no disc extrusion or stenosis.  There is calcification in both carotid arteries. There is a focal area of calcification in the right lobe of the thyroid. Visualized lung apices show mild scarring.  IMPRESSION: CT head: Atrophy with periventricular small vessel disease.  No intracranial mass, hemorrhage, or acute appearing infarct. No extra-axial fluid collections are identified.  CT cervical spine: No demonstrable fracture. Slight spondylolisthesis at C3-4 is felt to be due to underlying spondylosis. There is multilevel osteoarthritic change. No disc extrusion or stenosis appreciable.  There is carotid artery calcification bilaterally. There appears to be fairly significant narrowing at the sites of calcification, particularly on the left. Duplex carotid ultrasound may be helpful for further assessment in this regard. There is a small calcification in the right lobe of the thyroid of questionable significance.   Electronically Signed   By: Lowella Grip III M.D.   On: 05/15/2015 15:35   US Soft Tissue Head/neck  05/17/2015   CLINICAL DATA:  Right thyroid nodule identified by cervical CT.  EXAM: THYROID ULTRASOUND  TECHNIQUE: Ultrasound examination of the thyroid gland and adjacent soft tissues was performed.  COMPARISON:  CT of the cervical spine on 05/15/2015  FINDINGS: Right thyroid lobe  Measurements: 3.7 x 1.7 x 1.8 cm. Solid nodule in the mid to lower aspect of the right lobe measures approximately 1.4 x 1.3 x 1.2 cm. Internal macroscopic shadowing calcifications present within this nodule.  Left thyroid lobe  Measurements: 3.7 x 1.6 x 1.4 cm.  No nodules visualized.  Isthmus  Thickness: 0.3 cm.  No nodules visualized.  Lymphadenopathy  None visualized.   Incidental calcified plaque identified in the right common carotid artery.  IMPRESSION: 1. Solitary right thyroid nodule measuring 1.4 cm. Macroscopic calcification present which is more associated with benign nodules. The nodule does not meet size criteria for biopsy. Ultrasound surveillance is recommended in 12 months. 2. Carotid atherosclerosis with calcified plaque identified in the right common carotid artery.   Electronically Signed   By: Aletta Edouard M.D.   On: 05/17/2015 07:43   US Renal  05/17/2015   CLINICAL DATA:  Acute renal failure.  EXAM: RENAL / URINARY TRACT ULTRASOUND COMPLETE  COMPARISON:  None.  FINDINGS: Right Kidney:  Length: 8.7 cm. Diffuse cortical thinning. No hydronephrosis. 2.5 cm x 1.3 cm x 2 cm exophytic cyst arises from the upper pole. 1.4 cm x 0.8 cm x 1.0 cm exophytic cyst arises from the lower pole. No other masses.  Left Kidney:  Length: 11.3 cm. Mild cortical thinning. Normal parenchymal echogenicity. No mass or cyst. No stones. No hydronephrosis.  Bladder:  Appears normal for degree of bladder distention.  IMPRESSION: 1. No acute finding.  No hydronephrosis. 2. Bilateral cortical thinning, right greater than left. Smaller right kidney. Two right renal cysts.   Electronically Signed   By: Lajean Manes M.D.   On: 05/17/2015 12:45   Dg Chest Port 1 View  05/16/2015   CLINICAL DATA:  Shortness of breath.  EXAM: PORTABLE CHEST - 1 VIEW  COMPARISON:  05/05/2015.  FINDINGS: Trachea is midline. Heart size stable. Pacemaker lead tips project over the right atrium and right ventricle. Lungs are clear. No pleural fluid.  IMPRESSION: No acute findings.   Electronically Signed   By: Lorin Picket M.D.   On: 05/16/2015 14:40   Dg Humerus Left  05/15/2015   CLINICAL DATA:  81 year old female with pain and tenderness in the left upper extremity.  EXAM: LEFT HUMERUS - 2+ VIEW  COMPARISON:  Radiograph 05/03/2015  FINDINGS: Evaluation is limited due to superimposed dressing as well as  absence of true lateral projection. There is internal fixation of the previously seen olecranon fracture with plate and screw. There is irregularity of the radial head compatible  with previously seen fracture. The radial-capitellar alignment is preserved on the frontal projection. No definite new fracture identified. Evaluation of the distal humerus is limited on the lateral projection.  A left pectoral pacemaker device is partially visualized.  IMPRESSION: Interval placement of fixation plate and screw through the previously seen olecranon fracture. No new fracture identified.   Electronically Signed   By: Anner Crete M.D.   On: 05/15/2015 15:00   Dg Hand Complete Right  05/15/2015   CLINICAL DATA:  Fall.  Pain and tenderness in the right hand.  EXAM: RIGHT HAND - COMPLETE 3+ VIEW  COMPARISON:  05/15/2015 wrist radiographs  FINDINGS: Osteoarthritis noted with interphalangeal articular space narrowing. Spurring at the interphalangeal joint of the thumb.  Distal radial plate and screw fixator. Old nonunited fracture of the ulnar styloid.  Bony demineralization.  IMPRESSION: 1. 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 No compelling findings of acute bony fracture. 2. Bony demineralization.   Electronically Signed   By: Van Clines M.D.   On: 05/15/2015 14:54     CBC  Recent Labs Lab 05/15/15 1315 05/15/15 1450 05/15/15 1907 05/16/15 0525  WBC 11.4*  --  9.5 8.9  HGB 11.6* 12.6 10.3* 10.1*  HCT 34.0* 37.0 30.8* 29.8*  PLT 229  --  193 172  MCV 89.9  --  89.8 92.3  MCH 30.7  --  30.0 31.3  MCHC 34.1  --  33.4 33.9  RDW 15.9*  --  15.8* 16.1*  LYMPHSABS 0.7  --   --   --   MONOABS 0.3  --   --   --   EOSABS 0.1  --   --   --   BASOSABS 0.0  --   --   --     Chemistries   Recent Labs Lab 05/16/15 0525 05/17/15 0540 05/18/15 0530 05/19/15 0426 05/20/15 0552  NA 132* 133* 135 139 142  K 4.7 4.7 5.7* 4.4 4.5  CL 105 110 111 113* 118*  CO2 16* 17* 15* 15* 12*  GLUCOSE 117* 89 132* 155*  125*  BUN 41* 51* 62* 62* 62*  CREATININE 2.78* 2.93* 3.36* 2.97* 2.55*  CALCIUM 8.3* 8.4* 8.6* 8.5* 8.8*  AST 42*  --   --   --   --   ALT 46  --   --   --   --   ALKPHOS 61  --   --   --   --   BILITOT 0.3  --   --   --   --    ------------------------------------------------------------------------------------------------------------------ estimated creatinine clearance is 14.1 mL/min (by C-G formula based on Cr of 2.55). ------------------------------------------------------------------------------------------------------------------ No results for input(s): HGBA1C in the last 72 hours. ------------------------------------------------------------------------------------------------------------------ No results for input(s): CHOL, HDL, LDLCALC, TRIG, CHOLHDL, LDLDIRECT in the last 72 hours. ------------------------------------------------------------------------------------------------------------------ No results for input(s): TSH, T4TOTAL, T3FREE, THYROIDAB in the last 72 hours.  Invalid input(s): FREET3 ------------------------------------------------------------------------------------------------------------------ No results for input(s): VITAMINB12, FOLATE, FERRITIN, TIBC, IRON, RETICCTPCT in the last 72 hours.  Coagulation profile  Recent Labs Lab 05/16/15 0525  INR 1.27    No results for input(s): DDIMER in the last 72 hours.  Cardiac Enzymes No results for input(s): CKMB, TROPONINI, MYOGLOBIN in the last 168 hours.  Invalid input(s): CK ------------------------------------------------------------------------------------------------------------------ Invalid input(s): POCBNP   Time Spent in minutes   35   Rhoda Waldvogel K M.D on 05/21/2015 at 11:01 AM  Between 7am to 7pm - Pager - [phone]  After 7pm go to [email or web address] - password [facility]  [facility]   Office  [phone]

## 2015-05-22 LAB — RENAL FUNCTION PANEL
Albumin: 2.9 g/dL — ABNORMAL LOW (ref 3.5–5.0)
Anion gap: 11 (ref 5–15)
BUN: 74 mg/dL — ABNORMAL HIGH (ref 6–20)
CHLORIDE: 116 mmol/L — AB (ref 101–111)
CO2: 15 mmol/L — AB (ref 22–32)
Calcium: 9.8 mg/dL (ref 8.9–10.3)
Creatinine, Ser: 1.99 mg/dL — ABNORMAL HIGH (ref 0.44–1.00)
GFR, EST AFRICAN AMERICAN: 25 mL/min — AB (ref >60)
GFR, EST NON AFRICAN AMERICAN: 22 mL/min — AB (ref >60)
Glucose, Bld: 118 mg/dL — ABNORMAL HIGH (ref 65–99)
POTASSIUM: 4.6 mmol/L (ref 3.5–5.1)
Phosphorus: 3.9 mg/dL (ref 2.5–4.6)
SODIUM: 142 mmol/L (ref 135–145)

## 2015-05-22 LAB — ANTINUCLEAR ANTIBODIES, IFA: ANTINUCLEAR ANTIBODIES, IFA: NEGATIVE

## 2015-05-22 NOTE — Progress Notes (Signed)
Patient to be discharged to [place] facility. Family choosing to transport patient to facility. Report called to [facility] @1430 . Bartholomew Crews, RN

## 2015-05-22 NOTE — Clinical Social Work Placement (Signed)
   CLINICAL SOCIAL WORK PLACEMENT  NOTE  Date:  05/22/2015  Patient Details  Name: Chelsey Campbell MRN: [id number] Date of Birth: 1928-10-04  Clinical Social Work is seeking post-discharge placement for this patient at the [place] level of care (*CSW will initial, date and re-position this form in  chart as items are completed):  Yes   Patient/family provided with [place] Work Department's list of facilities offering this level of care within the geographic area requested by the patient (or if unable, by the patient's family).  Yes   Patient/family informed of their freedom to choose among providers that offer the needed level of care, that participate in Medicare, Medicaid or managed care program needed by the patient, have an available bed and are willing to accept the patient.  Yes   Patient/family informed of [facility]'s ownership interest in [facility] and [facility], as well as of the fact that they are under no obligation to receive care at these facilities.  PASRR submitted to EDS on 05/17/15     PASRR number received on 05/17/15     Existing PASRR number confirmed on       FL2 transmitted to all facilities in geographic area requested by pt/family on 05/17/15     FL2 transmitted to all facilities within larger geographic area on       Patient informed that his/her managed care company has contracts with or will negotiate with certain facilities, including the following:         05/18/15 - Patient/family informed of bed offers received.  Patient chooses bed at  [facility] recommends and patient chooses bed at      Patient to be transferred to  [facility] on  05/22/15.  Patient to be transferred to facility by  family   Patient family notified on  05/22/15 of transfer.  Name of family member notified:   patient's daughter, [name] at the bedside.     PHYSICIAN       Additional  Comment:    _______________________________________________ Sable Feil, LCSW 05/22/2015, 2:35 PM

## 2015-05-22 NOTE — Care Management (Signed)
Important Message  Patient Details  Name: Chelsey Campbell MRN: [id number] Date of Birth: [birth year]   Medicare Important Message Given:  Yes-second notification given    Loann Quill 05/22/2015, 11:05 AM

## 2015-06-18 DEATH — deceased

## 2015-06-25 ENCOUNTER — Ambulatory Visit: Payer: Medicare Other | Admitting: Neurology

## 2015-06-26 ENCOUNTER — Telehealth: Payer: Self-pay | Admitting: Surgery

## 2015-06-26 NOTE — Telephone Encounter (Signed)
Daughter, Stephania Fragmin, called to let us know that the patient passed away on 01-Jul-2015.

## 2016-04-30 ENCOUNTER — Encounter (HOSPITAL_COMMUNITY): Payer: Medicare Other

## 2016-04-30 ENCOUNTER — Other Ambulatory Visit (HOSPITAL_COMMUNITY): Payer: Medicare Other

## 2016-05-07 ENCOUNTER — Ambulatory Visit: Payer: Medicare Other | Admitting: Family

## 2016-09-24 IMAGING — CR DG CHEST 2V
3 series · 3 of 3 positions shown · non-contrast
Comparison: January 12, 2013.

CLINICAL DATA: Shortness of breath.

EXAM:
CHEST  2 VIEW

[w chest lat (1 of 2)]
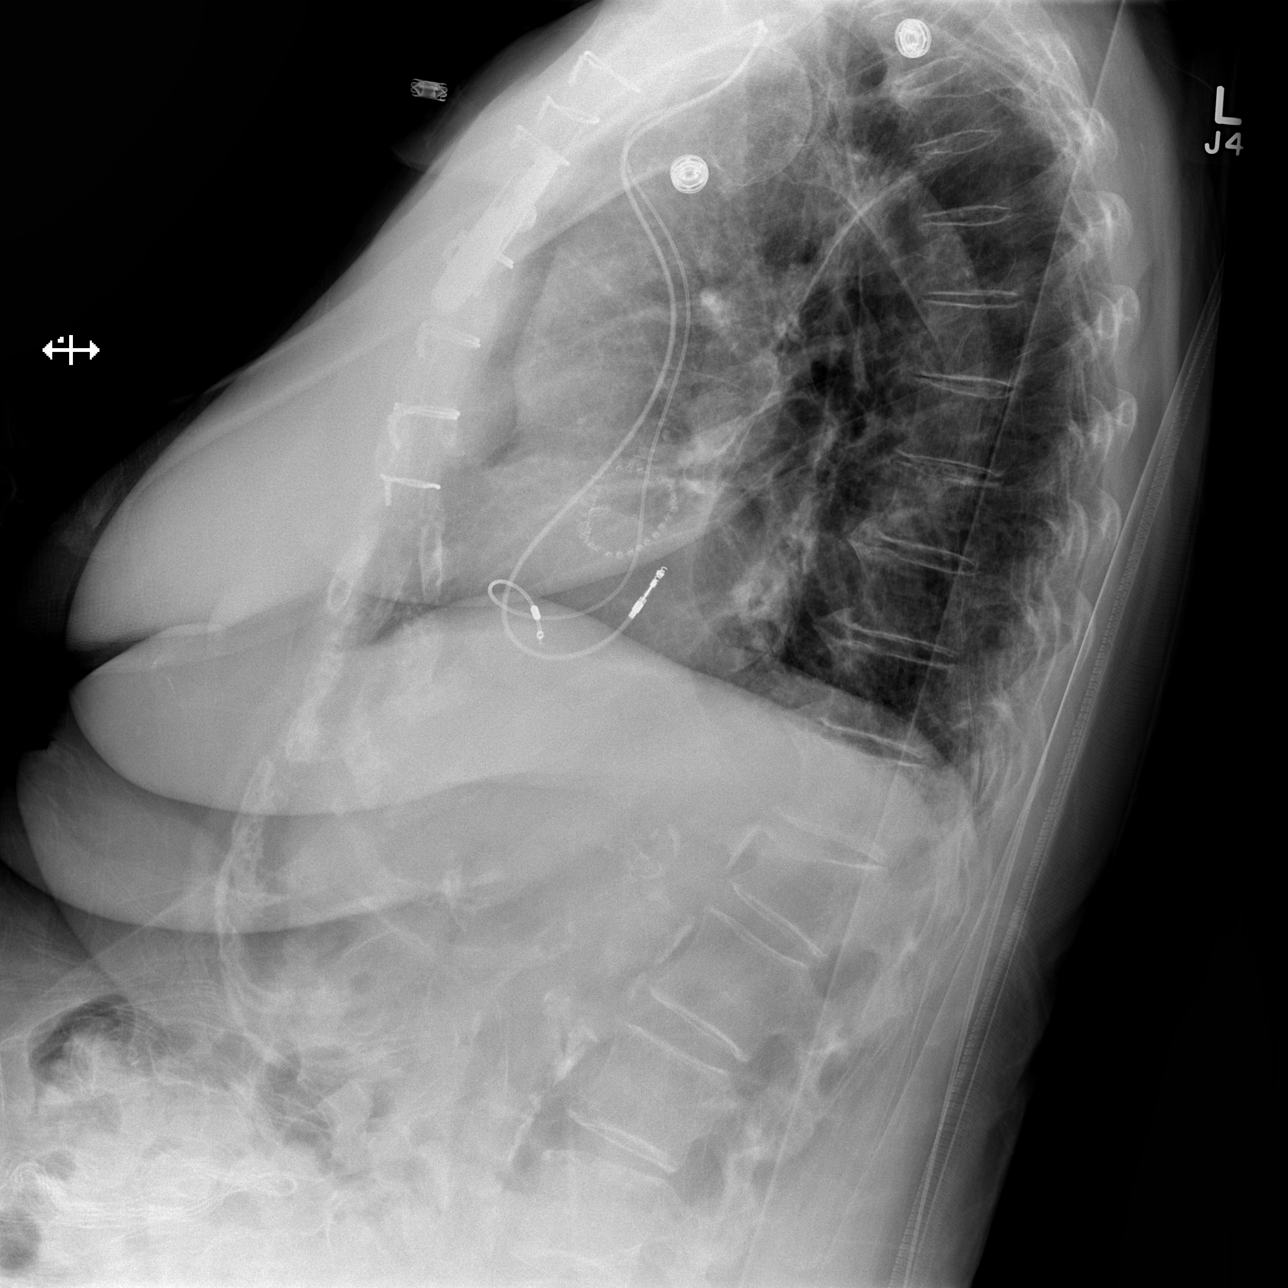

[w chest lat (2 of 2)]
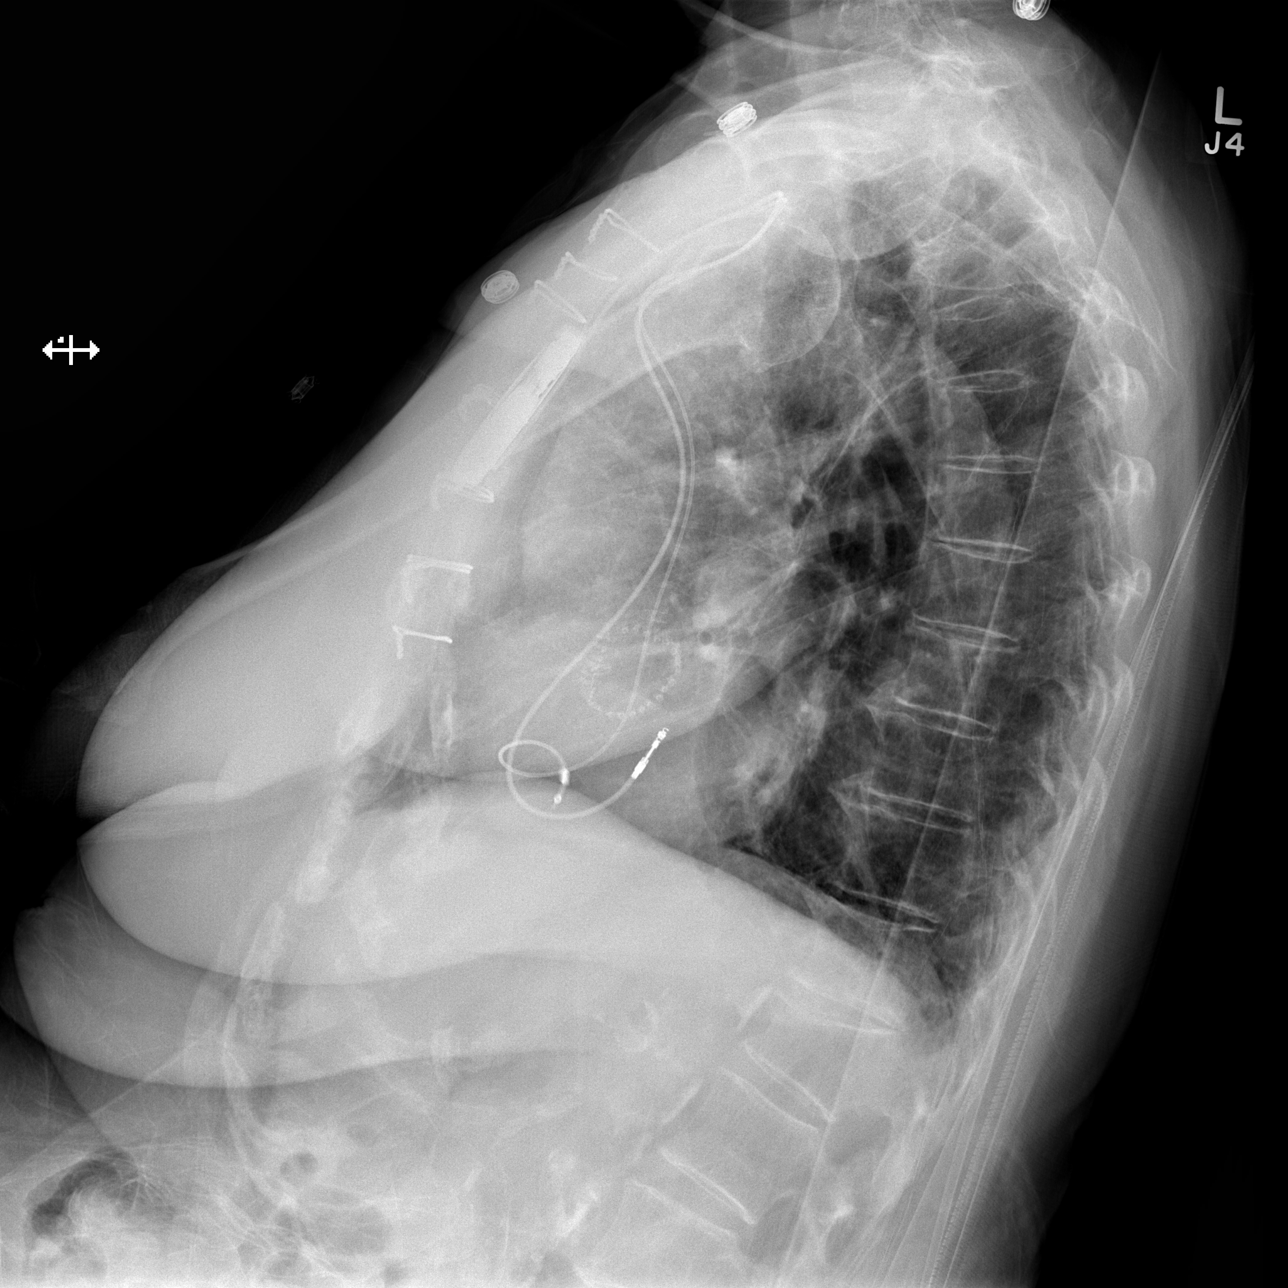

[x chest ap]
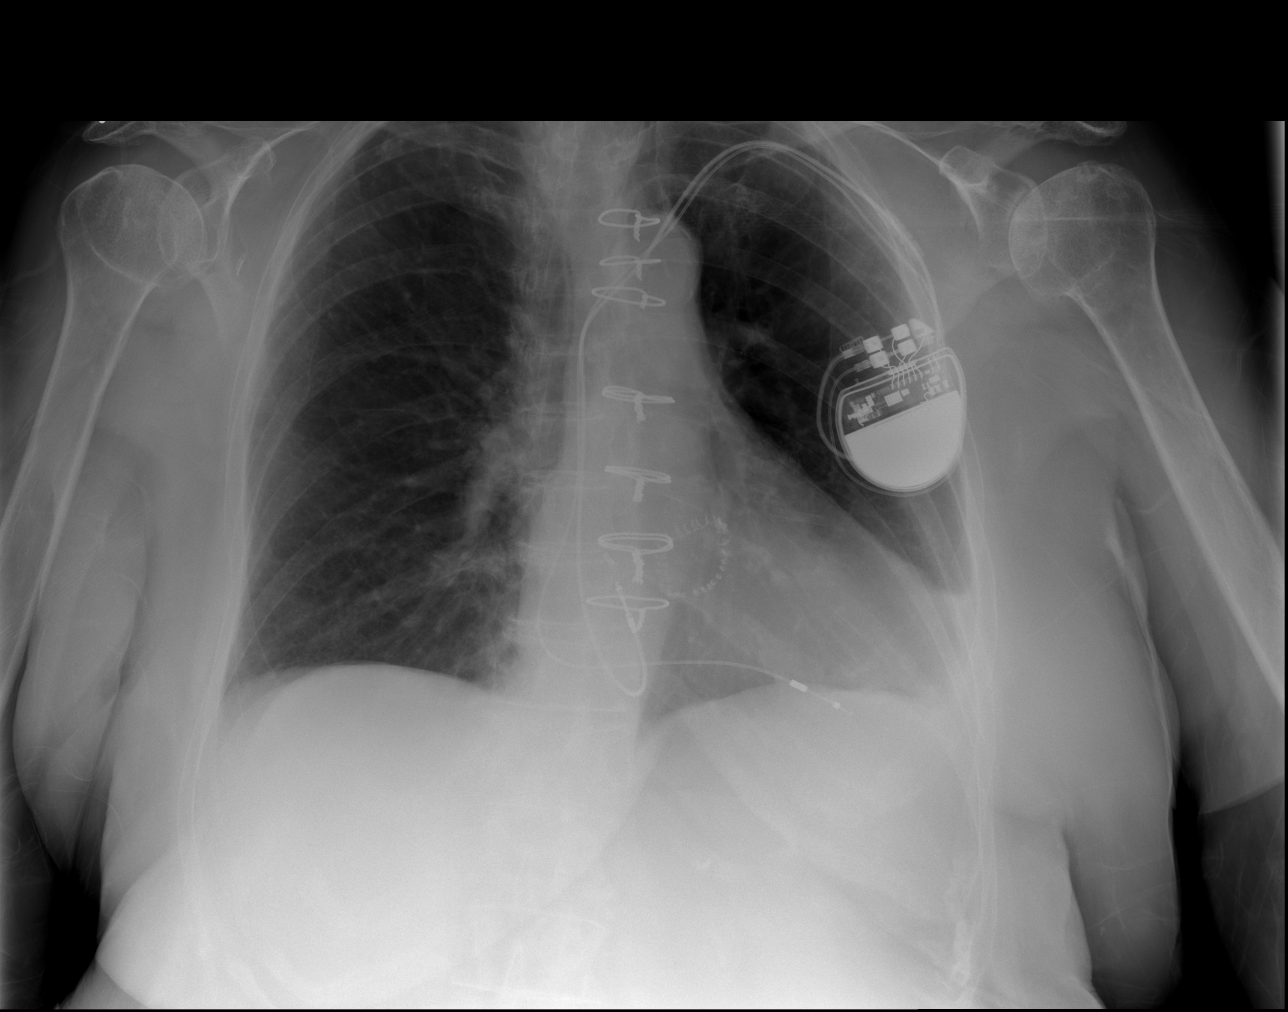

[3 of 3 positions shown; findings below may reference images not displayed]

FINDINGS: Stable cardiomediastinal silhouette. Status post cardiac valve
repair. Left sided pacemaker is unchanged in position. No
pneumothorax or pleural effusion is noted. No acute pulmonary
disease is noted.
IMPRESSION: No active cardiopulmonary disease.

## 2016-10-04 IMAGING — CR DG FOREARM 2V*L*
3 series · 3 of 3 positions shown · non-contrast
Comparison: 05/03/2015

CLINICAL DATA: Pain status post fall.

EXAM:
LEFT FOREARM - 2 VIEW

[x forearm ap left]
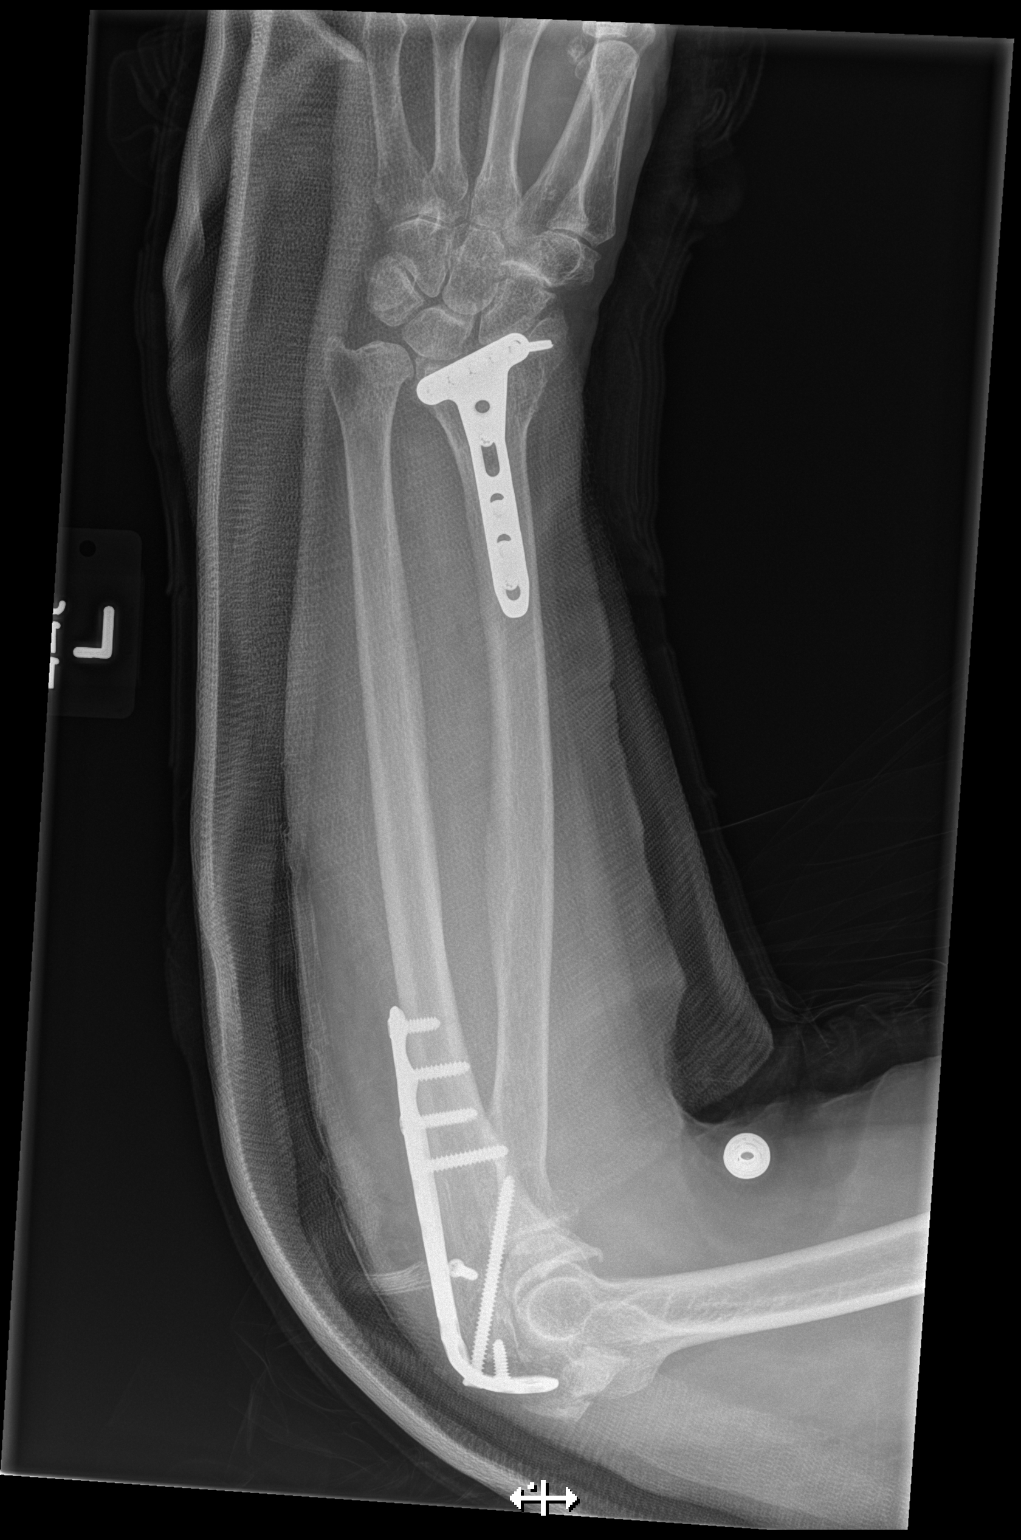

[x forearm lat left (1 of 2)]
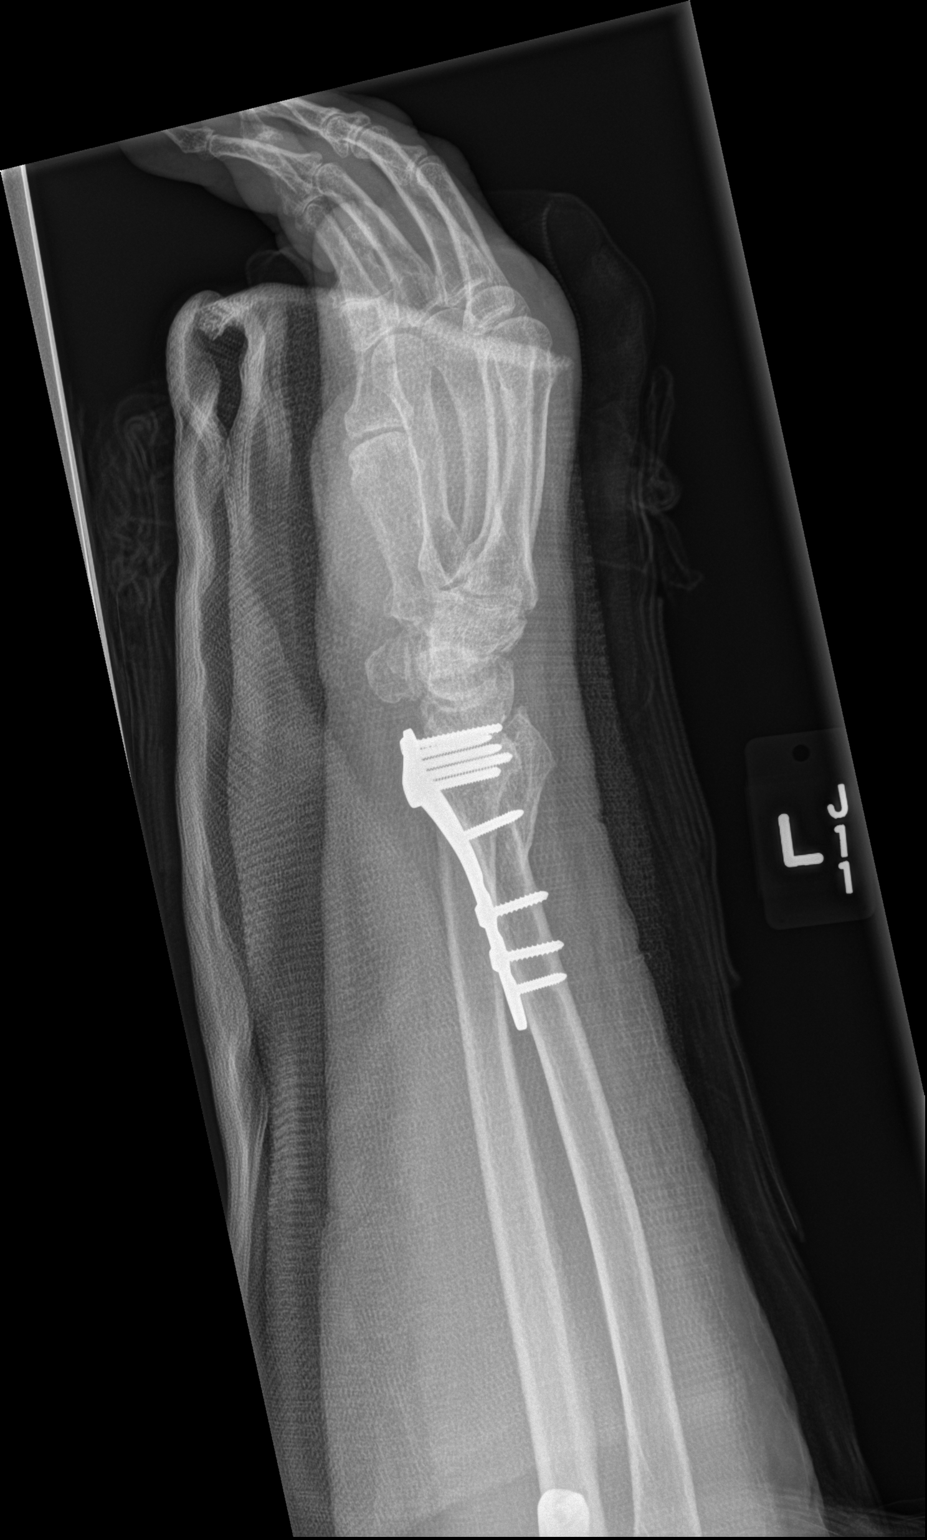

[x forearm lat left (2 of 2)]
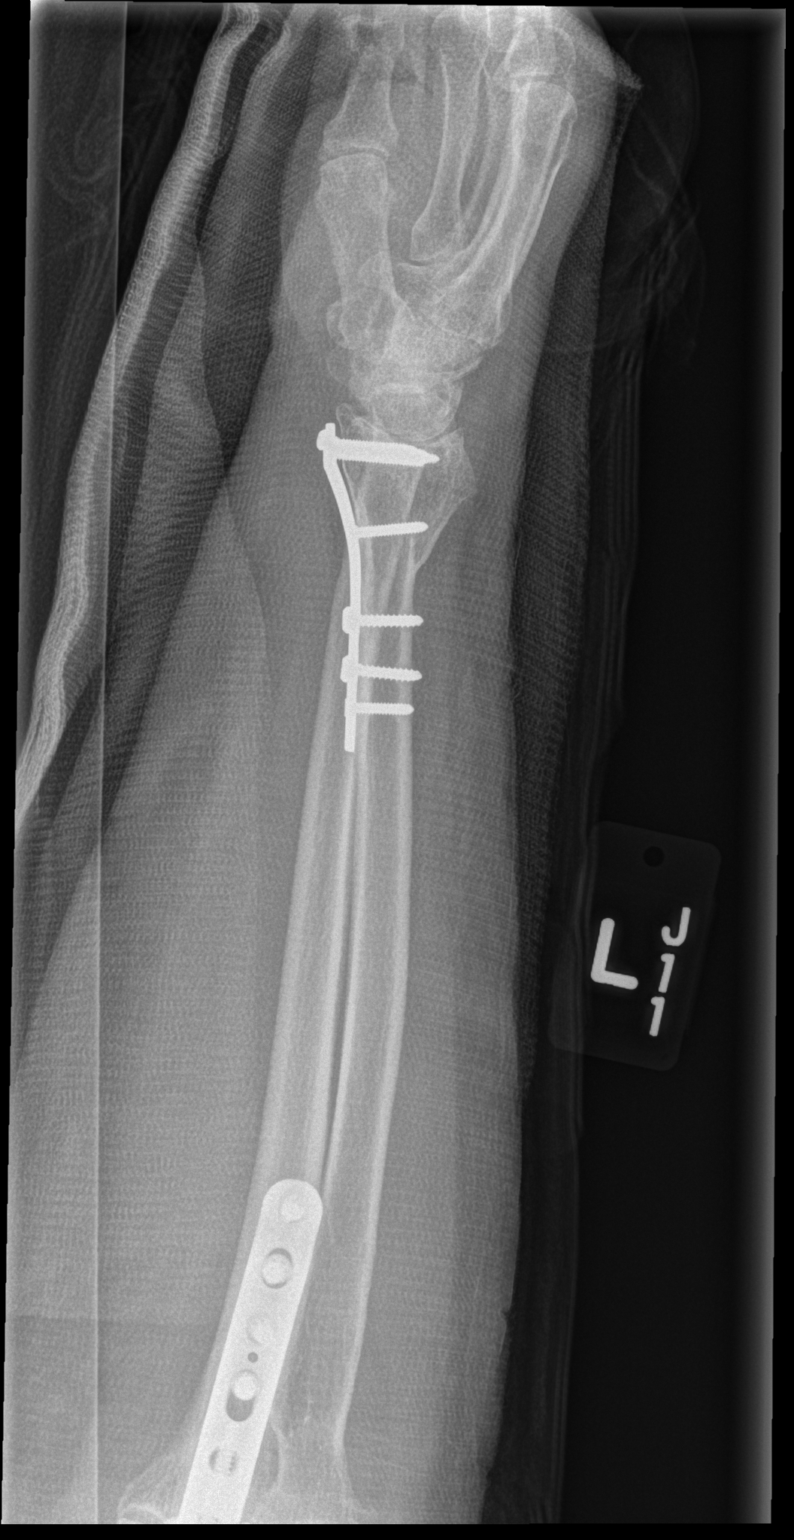

[3 of 3 positions shown; findings below may reference images not displayed]

FINDINGS: There has been an interval plate and screw fixation of the
previously demonstrated proximal ulnar fracture, and distal radial
fracture. There is some callus formation. Comminution of the
olecranon is noted. There is decreased soft tissue swelling. There
is no evidence of new acute fracture or dislocation. Cast material
obscures detailed evaluation of the soft tissues.
IMPRESSION: Status post plate and screw fixation of proximal ulnar and distal
radial fractures, with evidence of minimal healing.

No evidence of new acute fracture or subluxation.

## 2016-10-04 IMAGING — CR DG WRIST COMPLETE 3+V*R*
4 series · 4 of 4 positions shown · non-contrast
Comparison: None.

CLINICAL DATA: Pain following fall

EXAM:
RIGHT WRIST - COMPLETE 3+ VIEW

[x wrist pa right]
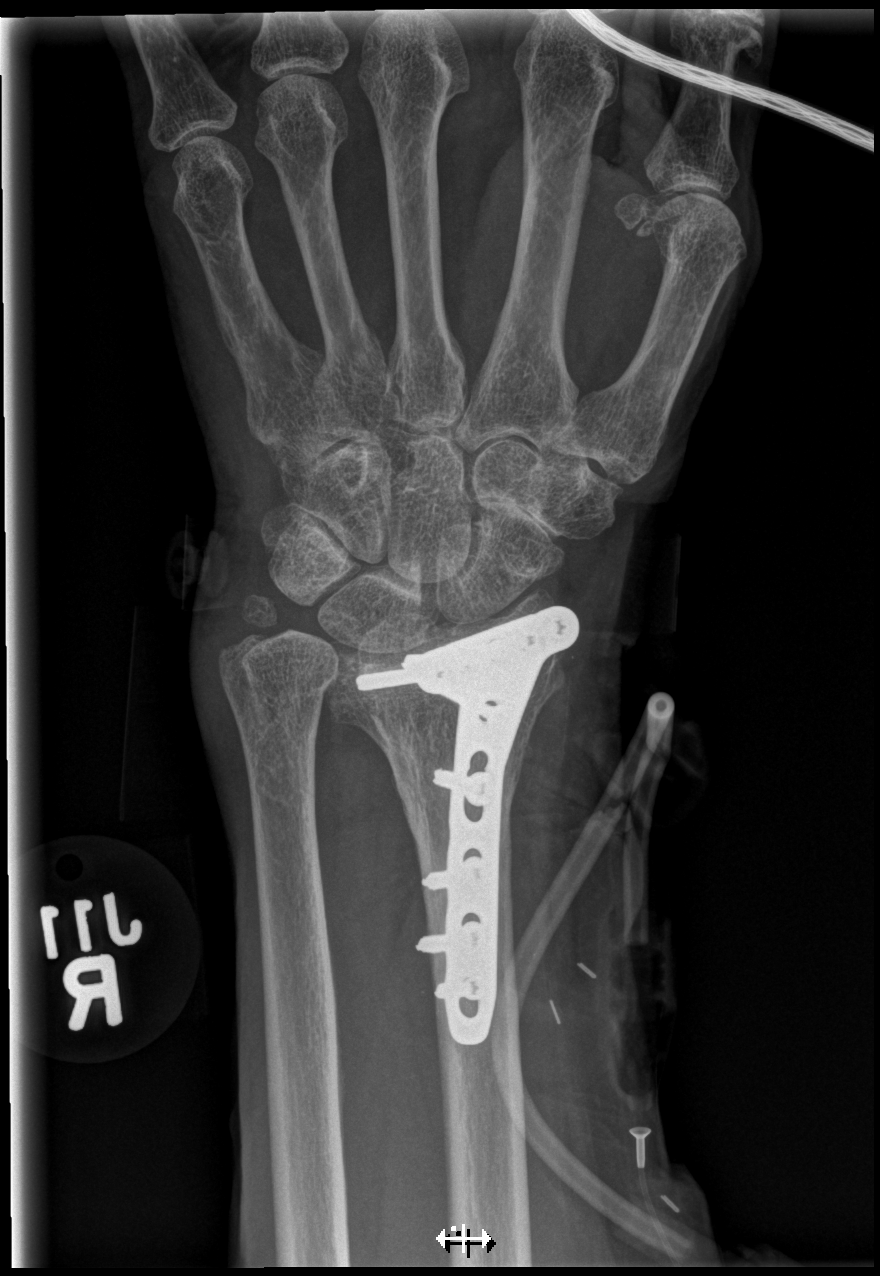

[x wrist obl right]
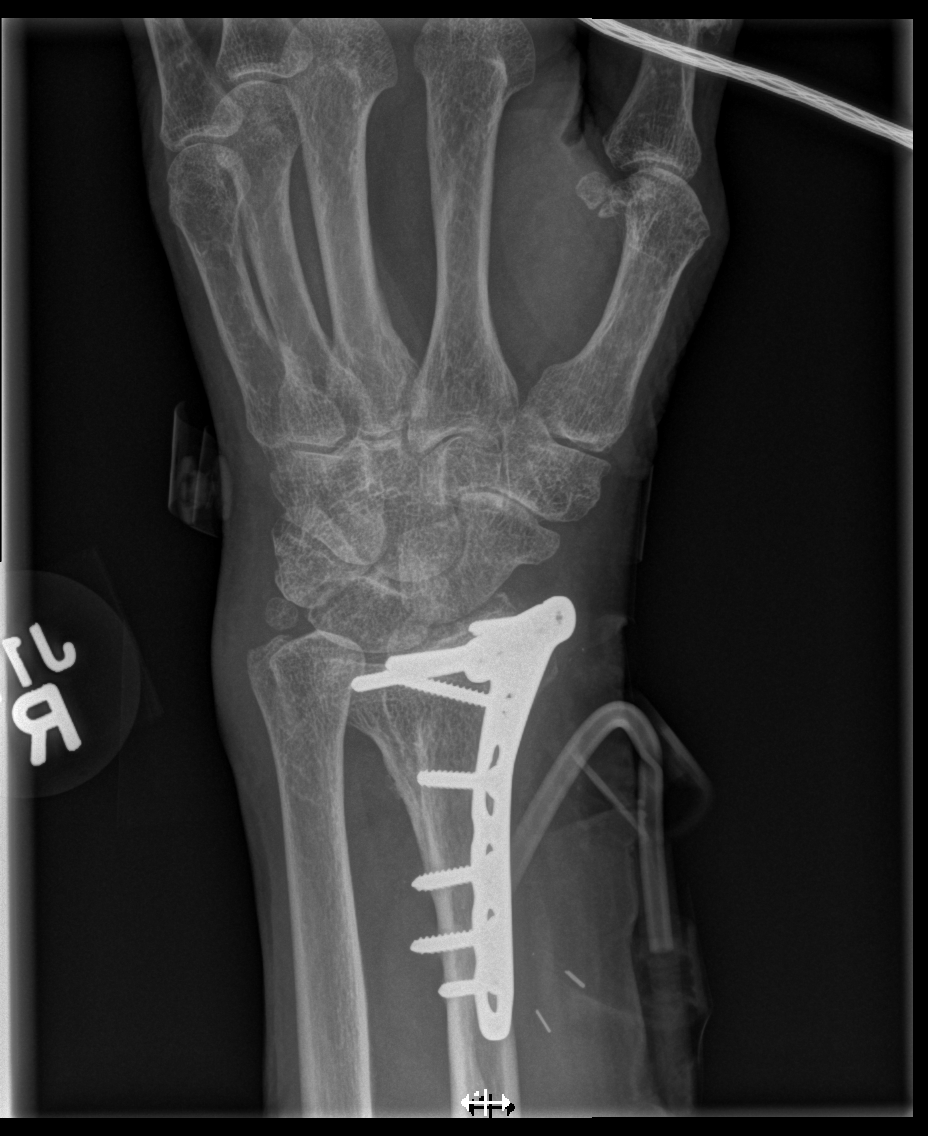

[x wrist lat right]
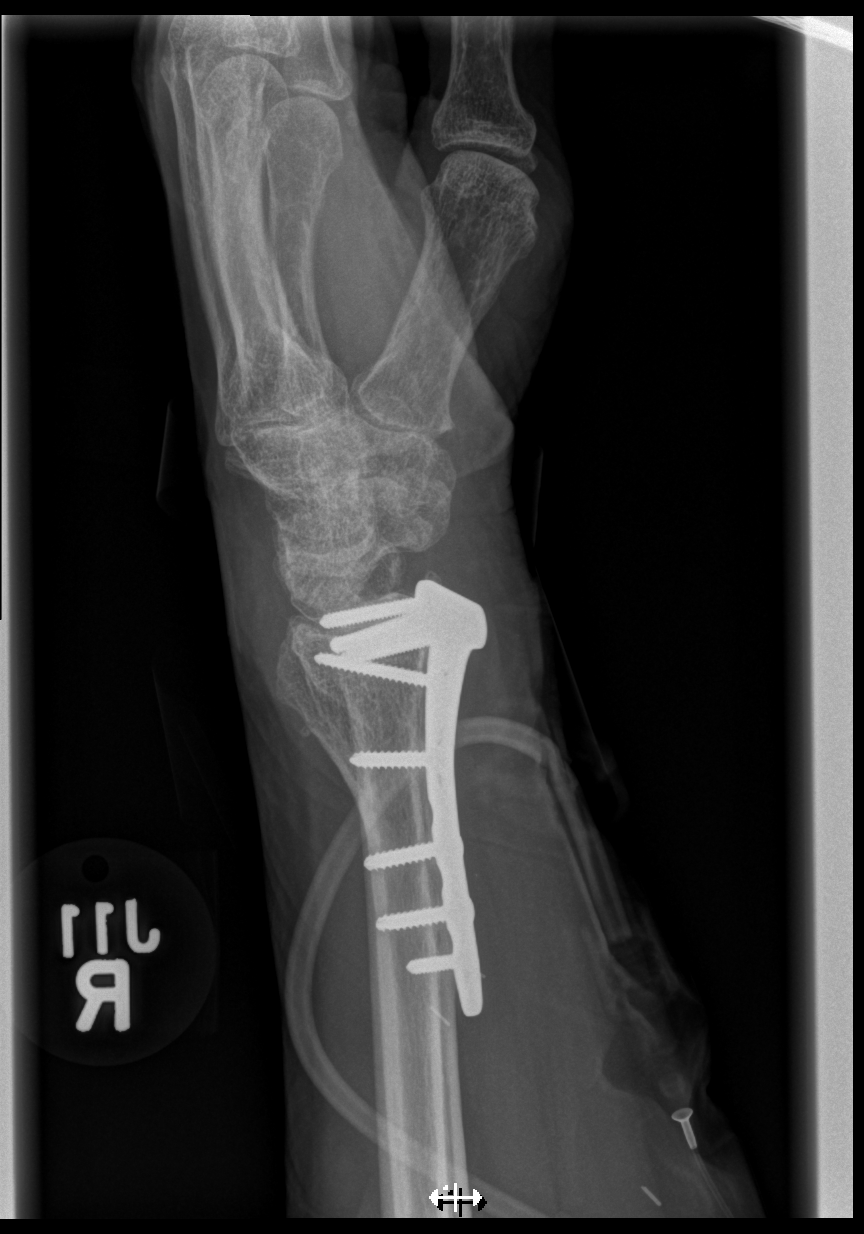

[x wrist navicular view right]
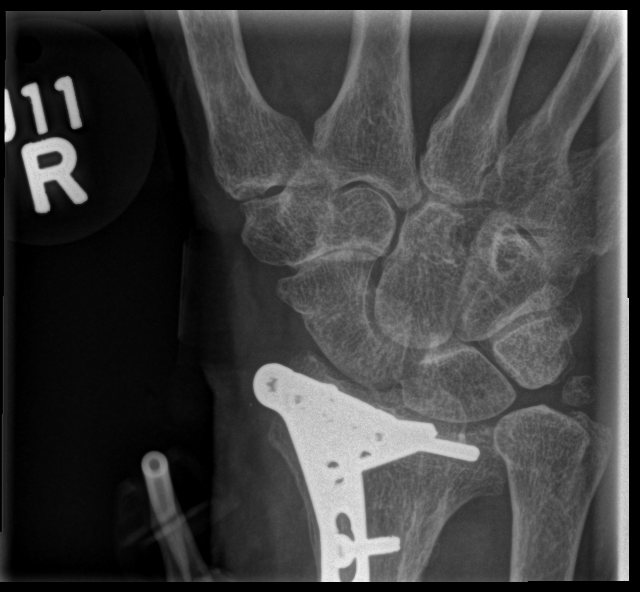

[4 of 4 positions shown; findings below may reference images not displayed]

FINDINGS: Frontal, oblique, lateral, and ulnar deviation scaphoid images were
obtained. There is postoperative change in the distal radius with
screw and plate fixation in this area. There is chronic avulsion of
the ulnar styloid with a bony fragment noted in the triangular
fibrocartilage. There is no acute fracture or dislocation. There is
moderate narrowing of the radiocarpal joint as well as the
scaphotrapezial joint. No erosive change.
IMPRESSION: Evidence of old trauma with remodeling in the distal radius and
chronic avulsion of the ulnar styloid. Areas of osteoarthritic
change. No acute fracture or dislocation.
